# Patient Record
Sex: Male | Born: 1935 | Race: White | Hispanic: No | Marital: Married | Smoking: Never smoker
Health system: Southern US, Community
[De-identification: ages and names within clinical notes are randomized; demographics above are authoritative.]

## PROBLEM LIST (undated history)

## (undated) DIAGNOSIS — F0390 Unspecified dementia without behavioral disturbance: Secondary | ICD-10-CM

## (undated) DIAGNOSIS — I2510 Atherosclerotic heart disease of native coronary artery without angina pectoris: Secondary | ICD-10-CM

## (undated) DIAGNOSIS — M4800 Spinal stenosis, site unspecified: Secondary | ICD-10-CM

## (undated) DIAGNOSIS — J40 Bronchitis, not specified as acute or chronic: Secondary | ICD-10-CM

## (undated) HISTORY — PX: JOINT REPLACEMENT: SHX530

## (undated) HISTORY — PX: APPENDECTOMY: SHX54

---

## 2009-11-03 ENCOUNTER — Emergency Department (HOSPITAL_BASED_OUTPATIENT_CLINIC_OR_DEPARTMENT_OTHER): Admission: EM | Admit: 2009-11-03 | Discharge: 2009-11-03 | Payer: Self-pay | Admitting: Emergency Medicine

## 2009-11-03 ENCOUNTER — Ambulatory Visit: Payer: Self-pay | Admitting: Diagnostic Radiology

## 2009-11-14 ENCOUNTER — Encounter: Admission: RE | Admit: 2009-11-14 | Discharge: 2009-11-14 | Payer: Self-pay | Admitting: Neurosurgery

## 2010-06-30 LAB — URINALYSIS, ROUTINE W REFLEX MICROSCOPIC
Bilirubin Urine: NEGATIVE
Hgb urine dipstick: NEGATIVE
Nitrite: NEGATIVE
Urobilinogen, UA: 1 mg/dL (ref 0.0–1.0)

## 2011-04-24 ENCOUNTER — Encounter (HOSPITAL_COMMUNITY): Payer: Self-pay

## 2011-04-24 ENCOUNTER — Emergency Department (HOSPITAL_COMMUNITY)
Admission: EM | Admit: 2011-04-24 | Discharge: 2011-04-24 | Disposition: A | Discharge status: Home / Self Care | Payer: Medicare Other | Attending: Emergency Medicine | Admitting: Emergency Medicine

## 2011-04-24 ENCOUNTER — Emergency Department (HOSPITAL_COMMUNITY): Payer: Medicare Other

## 2011-04-24 DIAGNOSIS — Z7982 Long term (current) use of aspirin: Secondary | ICD-10-CM | POA: Insufficient documentation

## 2011-04-24 DIAGNOSIS — R0789 Other chest pain: Secondary | ICD-10-CM

## 2011-04-24 DIAGNOSIS — M940 Chondrocostal junction syndrome [Tietze]: Secondary | ICD-10-CM

## 2011-04-24 DIAGNOSIS — R079 Chest pain, unspecified: Secondary | ICD-10-CM | POA: Insufficient documentation

## 2011-04-24 HISTORY — DX: Spinal stenosis, site unspecified: M48.00

## 2011-04-24 LAB — CARDIAC PANEL(CRET KIN+CKTOT+MB+TROPI)
Relative Index: INVALID (ref 0.0–2.5)
Total CK: 70 U/L (ref 7–232)

## 2011-04-24 MED ORDER — ASPIRIN 325 MG PO TABS: 325.0000 mg | ORAL_TABLET | ORAL | Status: DC

## 2011-04-24 MED ORDER — ASPIRIN 81 MG PO CHEW
324.0000 mg | CHEWABLE_TABLET | Freq: Once | ORAL | Status: AC
  Administered 2011-04-24: 324 mg via ORAL
  Filled 2011-04-24: qty 4

## 2011-04-24 NOTE — ED Notes (Signed)
Pt was moving boxes and furniture, pt with c/o pain in the left pec, aware that the pain is there, this am left arm was going numb

## 2011-04-24 NOTE — ED Provider Notes (Addendum)
History     CSN: [id number]  Arrival date & time 04/24/11  0847   First MD Initiated Contact with Patient 04/24/11 [phone]      Chief Complaint  Patient presents with  . Chest Pain    (Consider location/radiation/quality/duration/timing/severity/associated sxs/prior treatment) HPI Comments: The patient is an 78 year old male with no history of coronary artery disease, myocardial infarction, cardiac disease, hypertension, hyperlipidemia, or diabetes. The patient has no significant medical problems. The patient reports localized sharp and aching chest pain at the medial border of the left pectoralis muscle at the costochondral junction that he noticed yesterday with gradual onset, waxing and waning course, persistent, mild in severity, worse with palpation and movement through the chest, not worse with exertion. He denies any shortness of breath, nausea, or vomiting. He denies any diaphoresis. 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 The patient is a nonsmoker. His chest pain is reproducible on palpation of the left costochondral junction and pectoralis muscle.  Patient is a 78 y.o. male presenting with chest pain. The history is provided by the patient.  Chest Pain The chest pain began yesterday. Pertinent negatives for primary symptoms include no fever, no fatigue, no shortness of breath, no cough, no wheezing, no palpitations, no abdominal pain, no nausea, no vomiting and no dizziness.  Pertinent negatives for associated symptoms include no diaphoresis, no numbness and no weakness.     Past Medical History  Diagnosis Date  . Spinal stenosis     History reviewed. No pertinent past surgical history.  History reviewed. No pertinent family history.  History  Substance Use Topics  . Smoking status: Never Smoker   . Smokeless tobacco: Not on file  .  Alcohol Use: No      Review of Systems  Constitutional: Negative for fever, chills, diaphoresis and fatigue.  HENT: Negative.   Eyes: Negative.   Respiratory: Negative for cough, chest tightness, shortness of breath and wheezing.   Cardiovascular: Positive for chest pain. Negative for palpitations.  Gastrointestinal: Negative for nausea, vomiting, abdominal pain and abdominal distention.  Genitourinary: Negative.   Musculoskeletal: Negative for back pain.  Skin: Negative for color change and pallor.  Neurological: Negative for dizziness, syncope, weakness, light-headedness and numbness.  Psychiatric/Behavioral: Negative.     Allergies  Review of patient's allergies indicates no known allergies.  Home Medications   Current Outpatient Rx  Name Route Sig Dispense Refill  . ASPIRIN 325 MG PO TABS Oral Take 325 mg by mouth every 6 (six) hours as needed. For pain    . ASPIRIN EC 81 MG PO TBEC Oral Take 81 mg by mouth daily.    . IBUPROFEN 200 MG PO TABS Oral Take 600 mg by mouth every 8 (eight) hours as needed. For pain    . ADULT MULTIVITAMIN W/MINERALS CH Oral Take 1 tablet by mouth daily.    Marland Kitchen OVER THE COUNTER MEDICATION Oral Take 1 tablet by mouth at bedtime as needed. Sleep Aid      BP 147/78  Pulse 70  Temp 97.8 F (36.6 C)  Resp 15  SpO2 94%  Physical Exam  Nursing note and vitals reviewed. Constitutional: He is oriented to person, place, and time. He appears well-nourished. No distress.  HENT:  Head: Normocephalic and atraumatic.  Mouth/Throat: Oropharynx is clear and moist.  Eyes: EOM are normal. Pupils are  equal, round, and reactive to light.  Neck: Normal range of motion. Neck supple. No JVD present. No tracheal deviation present.  Cardiovascular: Normal rate, regular rhythm, S1 normal, S2 normal, normal heart sounds and intact distal pulses.   No extrasystoles are present. PMI is not displaced.  Exam reveals no gallop and no friction rub.   No murmur  heard. Pulmonary/Chest: Effort normal and breath sounds normal. No accessory muscle usage or stridor. Not tachypneic. No respiratory distress. He has no decreased breath sounds. He has no wheezes. He has no rhonchi. He has no rales. He exhibits tenderness and bony tenderness. He exhibits no crepitus and no retraction.       Palpation of the medial border of the left pectoralis muscle and at the junction of the costosternal joint, the patient has point tenderness that reproduces his pain when pressed.  Abdominal: Soft. Bowel sounds are normal. He exhibits no distension and no mass. There is no tenderness. There is no rebound and no guarding.  Musculoskeletal: Normal range of motion. He exhibits no edema and no tenderness.  Neurological: He is alert and oriented to person, place, and time. No cranial nerve deficit. He exhibits normal muscle tone.  Skin: Skin is warm and dry. No rash noted. He is not diaphoretic. No erythema. No pallor.  Psychiatric: He has a normal mood and affect. His behavior is normal. Judgment and thought content normal.    ED Course  Procedures (including critical care time)   Labs Reviewed  PRO B NATRIURETIC PEPTIDE  CARDIAC PANEL(CRET KIN+CKTOT+MB+TROPI)  CBC  BASIC METABOLIC PANEL   Dg Chest 2 View  04/24/2011  *RADIOLOGY REPORT*  Clinical Data: Left chest pain  CHEST - 2 VIEW  Comparison: None  Findings: Upper-normal size of cardiac silhouette. Mediastinal contours and pulmonary vascularity normal. Peribronchial thickening without infiltrate or effusion. No pneumothorax. 4 mm left mid lung nodule, appears dense, likely tiny calcified granuloma. Atherosclerotic calcification aortic arch. Bones appear demineralized.  IMPRESSION: Mild bronchitic changes.  Original Report Authenticated By: Lollie Marrow, M.D.    Date: 04/24/2011  Rate: 84  Rhythm: normal sinus rhythm  QRS Axis: left  Intervals: PR prolonged  ST/T Wave abnormalities: nonspecific T wave changes   Conduction Disutrbances:first-degree A-V block   Narrative Interpretation: Non-provocative EKG  Old EKG Reviewed: none available    No diagnosis found.  12:43 PM The patient initially only had cardiac enzymes, a EKG, and a chest x-ray ordered by nursing per protocol. I had seen the patient, but became busy with another patient who came in acutely in need of my attention. I did not have a chance to ordered a CBC and basic metabolic panel but I would like to order for the patient's evaluation. The patient's cardiac enzymes returned negative and on hearing this from the nursing staff, and he elected to leave the emergency department AGAINST MEDICAL ADVICE before finishing his care. At this time I do not have any s or cardiac source of chest pain and do perceive that it was chest wall pain. I do not think that the CBC or basic metabolic panel was likely to change the patient's course of care and therefore I'm not contacting him to return to the emergency department. This does appear to be chest wall pain.uggestion that this was a myocardial  MDM  Musculoskeletal chest pain, costochondritis, GERD, Gastrointestinal Chest Pain, Pleuritic Chest Pain, Pneumonia, Pneumothorax, Pulmonary Embolism, Esophageal Spasm, Arrhythmia considered among other potential etiologies in the patient's differential diagnosis.  Felisa Bonier, MD 04/24/11 1245  Felisa Bonier, MD 04/24/11 1248

## 2016-03-21 ENCOUNTER — Telehealth: Payer: Self-pay | Admitting: Internal Medicine

## 2016-05-05 ENCOUNTER — Emergency Department (HOSPITAL_BASED_OUTPATIENT_CLINIC_OR_DEPARTMENT_OTHER): Payer: Medicare Other

## 2016-05-05 ENCOUNTER — Emergency Department (HOSPITAL_BASED_OUTPATIENT_CLINIC_OR_DEPARTMENT_OTHER)
Admission: EM | Admit: 2016-05-05 | Discharge: 2016-05-05 | Disposition: A | Discharge status: Home / Self Care | Payer: Medicare Other | Attending: Emergency Medicine | Admitting: Emergency Medicine

## 2016-05-05 ENCOUNTER — Encounter (HOSPITAL_BASED_OUTPATIENT_CLINIC_OR_DEPARTMENT_OTHER): Payer: Self-pay | Admitting: Emergency Medicine

## 2016-05-05 DIAGNOSIS — R05 Cough: Secondary | ICD-10-CM | POA: Diagnosis present

## 2016-05-05 DIAGNOSIS — M7989 Other specified soft tissue disorders: Secondary | ICD-10-CM | POA: Diagnosis not present

## 2016-05-05 DIAGNOSIS — Z7982 Long term (current) use of aspirin: Secondary | ICD-10-CM | POA: Insufficient documentation

## 2016-05-05 DIAGNOSIS — I2510 Atherosclerotic heart disease of native coronary artery without angina pectoris: Secondary | ICD-10-CM | POA: Insufficient documentation

## 2016-05-05 DIAGNOSIS — Z79899 Other long term (current) drug therapy: Secondary | ICD-10-CM | POA: Insufficient documentation

## 2016-05-05 DIAGNOSIS — J209 Acute bronchitis, unspecified: Secondary | ICD-10-CM | POA: Diagnosis not present

## 2016-05-05 HISTORY — DX: Atherosclerotic heart disease of native coronary artery without angina pectoris: I25.10

## 2016-05-05 HISTORY — DX: Bronchitis, not specified as acute or chronic: J40

## 2016-05-05 MED ORDER — PREDNISONE 50 MG PO TABS
60.0000 mg | ORAL_TABLET | Freq: Once | ORAL | Status: AC
  Administered 2016-05-05: 60 mg via ORAL
  Filled 2016-05-05: qty 1

## 2016-05-05 MED ORDER — PREDNISONE 20 MG PO TABS: ORAL_TABLET | ORAL | 0 refills | Status: DC

## 2016-05-05 MED ORDER — DOXYCYCLINE HYCLATE 100 MG PO CAPS: 100.0000 mg | ORAL_CAPSULE | Freq: Two times a day (BID) | ORAL | 0 refills | Status: DC

## 2016-05-05 MED ORDER — ALBUTEROL SULFATE HFA 108 (90 BASE) MCG/ACT IN AERS
2.0000 | INHALATION_SPRAY | Freq: Once | RESPIRATORY_TRACT | Status: AC
  Administered 2016-05-05: 2 via RESPIRATORY_TRACT
  Filled 2016-05-05: qty 6.7

## 2016-05-05 MED ORDER — ALBUTEROL SULFATE (2.5 MG/3ML) 0.083% IN NEBU
5.0000 mg | INHALATION_SOLUTION | Freq: Once | RESPIRATORY_TRACT | Status: AC
  Administered 2016-05-05: 5 mg via RESPIRATORY_TRACT
  Filled 2016-05-05: qty 6

## 2016-05-05 MED ORDER — DOXYCYCLINE HYCLATE 100 MG PO TABS
100.0000 mg | ORAL_TABLET | Freq: Once | ORAL | Status: AC
  Administered 2016-05-05: 100 mg via ORAL
  Filled 2016-05-05: qty 1

## 2016-05-05 NOTE — ED Provider Notes (Signed)
[facility] Provider Note   CSN: [id number] Arrival date & time: 05/05/16  2032  By signing my name below, I, Rosario Adie, attest that this documentation has been prepared under the direction and in the presence of Rolan Bucco, MD. Electronically Signed: Rosario Adie, ED Scribe. 05/05/16. 10:12 PM.  History   Chief Complaint Chief Complaint  Patient presents with  . Cough   The history is provided by the patient. No language interpreter was used.    HPI Comments: Jacob Kirk is a 83 y.o. male who presents to the Emergency Department complaining of persistent shortness of breath beginning this morning, significantly worsening since this morning. Pt reports associated rhinorrhea, dry cough, and congestion over the past two days as well. Pt additionally reports that he has been experiencing chest pain secondary to coughing only. He also notes that he has bilateral leg swelling at baseline, but this has remained unchanged since the onset of his symptoms. Pt has been using saline sprays at home without relief of his symptoms. Per family member, he has a h/o bronchitis yearly and his symptoms are similar to this. He has used Albuterol inhalers and nebulizing treatments for this issue with prior bronchitis which has previously controlled his symptoms related to this. Pt denies nausea, vomiting, fever, or any other associated symptoms.   Past Medical History:  Diagnosis Date  . Bronchitis   . Coronary artery disease   . Spinal stenosis    There are no active problems to display for this patient.  Past Surgical History:  Procedure Laterality Date  . JOINT REPLACEMENT      Home Medications    Prior to Admission medications   Medication Sig Start Date End Date Taking? Authorizing Provider  aspirin 325 MG tablet Take 325 mg by mouth every 6 (six) hours as needed. For pain   Yes Historical Provider, MD  carvedilol (COREG) 12.5 MG tablet Take by mouth 1 day  or 1 dose.   Yes Historical Provider, MD  aspirin EC 81 MG tablet Take 81 mg by mouth daily.    Historical Provider, MD  doxycycline (VIBRAMYCIN) 100 MG capsule Take 1 capsule (100 mg total) by mouth 2 (two) times daily. One po bid x 7 days 05/05/16   Rolan Bucco, MD  ibuprofen (ADVIL,MOTRIN) 200 MG tablet Take 600 mg by mouth every 8 (eight) hours as needed. For pain    Historical Provider, MD  Multiple Vitamin (MULITIVITAMIN WITH MINERALS) TABS Take 1 tablet by mouth daily.    Historical Provider, MD  OVER THE COUNTER MEDICATION Take 1 tablet by mouth at bedtime as needed. Sleep Aid    Historical Provider, MD  predniSONE (DELTASONE) 20 MG tablet 2 tabs po daily x 4 days 05/05/16   Rolan Bucco, MD   Family History No family history on file.  Social History Social History  Substance Use Topics  . Smoking status: Never Smoker  . Smokeless tobacco: Never Used  . Alcohol use No   Allergies   Patient has no known allergies.  Review of Systems Review of Systems  Constitutional: Negative for chills, diaphoresis, fatigue and fever.  HENT: Positive for congestion and rhinorrhea. Negative for sneezing.   Eyes: Negative.   Respiratory: Positive for cough and shortness of breath. Negative for chest tightness.   Cardiovascular: Positive for chest pain (2/2 coughing) and leg swelling (baseline, unchanged).  Gastrointestinal: Negative for abdominal pain, blood in stool, diarrhea, nausea and vomiting.  Genitourinary: Negative for difficulty urinating,  flank pain, frequency and hematuria.  Musculoskeletal: Negative for arthralgias and back pain.  Skin: Negative for rash.  Neurological: Negative for dizziness, speech difficulty, weakness, numbness and headaches.  All other systems reviewed and are negative.  Physical Exam Updated Vital Signs BP 136/65 (BP Location: Right Arm)   Pulse 104   Temp 97.6 F (36.4 C) (Oral)   Resp 18   Ht 5\' 7"  (1.702 m)   Wt 180 lb (81.6 kg)   SpO2 96%   BMI  28.19 kg/m   Physical Exam  Constitutional: He is oriented to person, place, and time. He appears well-developed and well-nourished.  HENT:  Head: Normocephalic and atraumatic.  Right Ear: External ear normal.  Left Ear: External ear normal.  Mouth/Throat: Oropharynx is clear and moist. No oropharyngeal exudate.  Eyes: Pupils are equal, round, and reactive to light.  Neck: Normal range of motion. Neck supple.  Cardiovascular: Normal rate, regular rhythm and normal heart sounds.   Pulmonary/Chest: Effort normal. No respiratory distress. He has wheezes. He has no rales. He exhibits no tenderness.  Some diminished breath sounds bilaterally and some expiratory wheezing, Mild tachypnea but no increased work of breathing.   Abdominal: Soft. Bowel sounds are normal. There is no tenderness. There is no rebound and no guarding.  Musculoskeletal: Normal range of motion. He exhibits no edema.  Lymphadenopathy:    He has no cervical adenopathy.  Neurological: He is alert and oriented to person, place, and time.  Skin: Skin is warm and dry. No rash noted.  Psychiatric: He has a normal mood and affect.   ED Treatments / Results  DIAGNOSTIC STUDIES: Oxygen Saturation is 100% on RA, normal by my interpretation.   COORDINATION OF CARE: 10:10 PM-Discussed next steps with pt including CXR. Pt verbalized understanding and is agreeable with the plan.   Labs (all labs ordered are listed, but only abnormal results are displayed) Labs Reviewed - No data to display  EKG  EKG Interpretation None      Radiology Dg Chest 2 View  Result Date: 05/05/2016 CLINICAL DATA:  Cough and congestion for several days EXAM: CHEST  2 VIEW COMPARISON:  None. FINDINGS: Normal mediastinum and cardiac silhouette. Chronic central bronchitic markings. Normal pulmonary vasculature. No effusion, infiltrate, or pneumothorax. IMPRESSION: Chronic bronchitic markings.  No acute findings. Electronically Signed   By: Genevive BiStewart   Edmunds M.D.   On: 05/05/2016 21:40   Procedures Procedures   Medications Ordered in ED Medications  albuterol (PROVENTIL HFA;VENTOLIN HFA) 108 (90 Base) MCG/ACT inhaler 2 puff (not administered)  doxycycline (VIBRA-TABS) tablet 100 mg (not administered)  predniSONE (DELTASONE) tablet 60 mg (60 mg Oral Given 05/05/16 2217)  albuterol (PROVENTIL) (2.5 MG/3ML) 0.083% nebulizer solution 5 mg (5 mg Nebulization Given 05/05/16 2221)    Initial Impression / Assessment and Plan / ED Course  I have reviewed the triage vital signs and the nursing notes.  Pertinent labs & imaging results that were available during my care of the patient were reviewed by me and considered in my medical decision making (see chart for details).     Patient presents with cough and wheezing. His chest x-ray does not show evidence of pneumonia or fluid overload. His symptoms are consistent with bronchitis. He was given a nebulizer treatment in the ED and is feeling much better after this. He was also given a dose of prednisone. He was discharged home in good condition. He is maintaining normal oxygen saturations. He has no increased work of breathing. He  was dispensed an albuterol inhaler to use 2 puffs every 4-6 hours for the next few days. He was given prescriptions for a five-day course of prednisone and doxycycline. He was encouraged to follow-up with his PCP. Return precautions were given.  Final Clinical Impressions(s) / ED Diagnoses   Final diagnoses:  Acute bronchitis, unspecified organism   New Prescriptions New Prescriptions   DOXYCYCLINE (VIBRAMYCIN) 100 MG CAPSULE    Take 1 capsule (100 mg total) by mouth 2 (two) times daily. One po bid x 7 days   PREDNISONE (DELTASONE) 20 MG TABLET    2 tabs po daily x 4 days   I personally performed the services described in this documentation, which was scribed in my presence.  The recorded information has been reviewed and considered.      Rolan Bucco,  MD 05/05/16 [phone]

## 2016-05-05 NOTE — ED Notes (Signed)
Pt given d/c instructions as per chart. Verbalizes understanding. No questions. Rx x 2. 

## 2016-05-05 NOTE — ED Triage Notes (Addendum)
Per son, congestion, cough and SOB, today. Prior hx of bronchitis

## 2017-08-27 ENCOUNTER — Other Ambulatory Visit: Payer: Self-pay | Admitting: Ophthalmology

## 2017-08-27 DIAGNOSIS — H5462 Unqualified visual loss, left eye, normal vision right eye: Secondary | ICD-10-CM

## 2017-08-29 ENCOUNTER — Ambulatory Visit
Admission: RE | Admit: 2017-08-29 | Discharge: 2017-08-29 | Disposition: A | Discharge status: Home / Self Care | Payer: Medicare Other | Source: Ambulatory Visit | Attending: Ophthalmology | Admitting: Ophthalmology

## 2017-08-29 DIAGNOSIS — H5462 Unqualified visual loss, left eye, normal vision right eye: Secondary | ICD-10-CM

## 2017-11-26 ENCOUNTER — Encounter: Payer: Self-pay | Admitting: Internal Medicine

## 2019-01-12 ENCOUNTER — Emergency Department (HOSPITAL_COMMUNITY): Payer: Medicare Other

## 2019-01-12 ENCOUNTER — Observation Stay (HOSPITAL_COMMUNITY): Payer: Medicare Other

## 2019-01-12 ENCOUNTER — Other Ambulatory Visit: Payer: Self-pay

## 2019-01-12 ENCOUNTER — Encounter (HOSPITAL_COMMUNITY): Payer: Self-pay | Admitting: Emergency Medicine

## 2019-01-12 ENCOUNTER — Observation Stay (HOSPITAL_COMMUNITY)
Admission: EM | Admit: 2019-01-12 | Discharge: 2019-01-13 | Disposition: A | Discharge status: Home / Self Care | Payer: Medicare Other | Attending: Internal Medicine | Admitting: Internal Medicine

## 2019-01-12 DIAGNOSIS — I4510 Unspecified right bundle-branch block: Secondary | ICD-10-CM | POA: Insufficient documentation

## 2019-01-12 DIAGNOSIS — Z79899 Other long term (current) drug therapy: Secondary | ICD-10-CM | POA: Diagnosis not present

## 2019-01-12 DIAGNOSIS — F5101 Primary insomnia: Secondary | ICD-10-CM

## 2019-01-12 DIAGNOSIS — Z66 Do not resuscitate: Secondary | ICD-10-CM | POA: Insufficient documentation

## 2019-01-12 DIAGNOSIS — I10 Essential (primary) hypertension: Secondary | ICD-10-CM | POA: Diagnosis not present

## 2019-01-12 DIAGNOSIS — I200 Unstable angina: Secondary | ICD-10-CM | POA: Diagnosis not present

## 2019-01-12 DIAGNOSIS — I453 Trifascicular block: Secondary | ICD-10-CM | POA: Diagnosis not present

## 2019-01-12 DIAGNOSIS — I2510 Atherosclerotic heart disease of native coronary artery without angina pectoris: Secondary | ICD-10-CM | POA: Diagnosis not present

## 2019-01-12 DIAGNOSIS — Z20828 Contact with and (suspected) exposure to other viral communicable diseases: Secondary | ICD-10-CM | POA: Insufficient documentation

## 2019-01-12 DIAGNOSIS — R072 Precordial pain: Secondary | ICD-10-CM | POA: Diagnosis not present

## 2019-01-12 DIAGNOSIS — I252 Old myocardial infarction: Secondary | ICD-10-CM | POA: Diagnosis not present

## 2019-01-12 DIAGNOSIS — I340 Nonrheumatic mitral (valve) insufficiency: Secondary | ICD-10-CM | POA: Insufficient documentation

## 2019-01-12 DIAGNOSIS — H409 Unspecified glaucoma: Secondary | ICD-10-CM

## 2019-01-12 DIAGNOSIS — R079 Chest pain, unspecified: Secondary | ICD-10-CM | POA: Diagnosis present

## 2019-01-12 DIAGNOSIS — E785 Hyperlipidemia, unspecified: Secondary | ICD-10-CM | POA: Insufficient documentation

## 2019-01-12 LAB — CBC
HCT: 41.3 % (ref 39.0–52.0)
HCT: 39.6 % (ref 39.0–52.0)
Hemoglobin: 13.3 g/dL (ref 13.0–17.0)
Hemoglobin: 13.5 g/dL (ref 13.0–17.0)
MCH: 32.4 pg (ref 26.0–34.0)
MCH: 32.8 pg (ref 26.0–34.0)
MCHC: 32.2 g/dL (ref 30.0–36.0)
MCHC: 34.1 g/dL (ref 30.0–36.0)
MCV: 100.5 fL — ABNORMAL HIGH (ref 80.0–100.0)
MCV: 96.1 fL (ref 80.0–100.0)
Platelets: 204 10*3/uL (ref 150–400)
Platelets: 182 10*3/uL (ref 150–400)
RBC: 4.11 MIL/uL — ABNORMAL LOW (ref 4.22–5.81)
RBC: 4.12 MIL/uL — ABNORMAL LOW (ref 4.22–5.81)
RDW: 13.7 % (ref 11.5–15.5)
RDW: 13.6 % (ref 11.5–15.5)
WBC: 5.6 10*3/uL (ref 4.0–10.5)
WBC: 4.2 10*3/uL (ref 4.0–10.5)
nRBC: 0 % (ref 0.0–0.2)
nRBC: 0 % (ref 0.0–0.2)

## 2019-01-12 LAB — TROPONIN I (HIGH SENSITIVITY)
Troponin I (High Sensitivity): 4 ng/L (ref <18)
Troponin I (High Sensitivity): 5 ng/L (ref <18)

## 2019-01-12 LAB — BASIC METABOLIC PANEL
Anion gap: 9 (ref 5–15)
BUN: 29 mg/dL — ABNORMAL HIGH (ref 8–23)
CO2: 20 mmol/L — ABNORMAL LOW (ref 22–32)
Calcium: 8.5 mg/dL — ABNORMAL LOW (ref 8.9–10.3)
Chloride: 109 mmol/L (ref 98–111)
Creatinine, Ser: 1.12 mg/dL (ref 0.61–1.24)
GFR calc Af Amer: >60 mL/min (ref >60)
GFR calc non Af Amer: 57 mL/min — ABNORMAL LOW (ref >60)
Glucose, Bld: 108 mg/dL — ABNORMAL HIGH (ref 70–99)
Potassium: 3.9 mmol/L (ref 3.5–5.1)
Sodium: 138 mmol/L (ref 135–145)

## 2019-01-12 LAB — CREATININE, SERUM
Creatinine, Ser: 0.98 mg/dL (ref 0.61–1.24)
GFR calc Af Amer: >60 mL/min (ref >60)
GFR calc non Af Amer: >60 mL/min (ref >60)

## 2019-01-12 LAB — SARS CORONAVIRUS 2 (TAT 6-24 HRS): SARS Coronavirus 2: NEGATIVE

## 2019-01-12 LAB — D-DIMER, QUANTITATIVE: D-Dimer, Quant: 1.24 ug/mL-FEU — ABNORMAL HIGH (ref 0.00–0.50)

## 2019-01-12 MED ORDER — ONDANSETRON HCL 4 MG/2ML IJ SOLN: 4.0000 mg | Freq: Four times a day (QID) | INTRAMUSCULAR | Status: DC | PRN

## 2019-01-12 MED ORDER — ASPIRIN EC 81 MG PO TBEC
81.0000 mg | DELAYED_RELEASE_TABLET | Freq: Every day | ORAL | Status: DC
  Administered 2019-01-13: 81 mg via ORAL
  Filled 2019-01-12: qty 1

## 2019-01-12 MED ORDER — LIFITEGRAST 5 % OP SOLN: 1.0000 [drp] | Freq: Two times a day (BID) | OPHTHALMIC | Status: DC

## 2019-01-12 MED ORDER — LATANOPROST 0.005 % OP SOLN
1.0000 [drp] | Freq: Every day | OPHTHALMIC | Status: DC
  Filled 2019-01-12: qty 2.5

## 2019-01-12 MED ORDER — IOHEXOL 300 MG/ML  SOLN
100.0000 mL | Freq: Once | INTRAMUSCULAR | Status: AC | PRN
  Administered 2019-01-12: 100 mL via INTRAVENOUS

## 2019-01-12 MED ORDER — DIPHENHYDRAMINE HCL 25 MG PO CAPS
25.0000 mg | ORAL_CAPSULE | Freq: Every day | ORAL | Status: DC
  Administered 2019-01-13: 25 mg via ORAL
  Filled 2019-01-12: qty 1

## 2019-01-12 MED ORDER — ACETAMINOPHEN 325 MG PO TABS: 650.0000 mg | ORAL_TABLET | ORAL | Status: DC | PRN

## 2019-01-12 MED ORDER — ENOXAPARIN SODIUM 40 MG/0.4ML ~~LOC~~ SOLN
40.0000 mg | SUBCUTANEOUS | Status: DC
  Administered 2019-01-12: 40 mg via SUBCUTANEOUS
  Filled 2019-01-12: qty 0.4

## 2019-01-12 MED ORDER — SODIUM CHLORIDE 0.9% FLUSH: 3.0000 mL | Freq: Once | INTRAVENOUS | Status: DC

## 2019-01-12 MED ORDER — OLOPATADINE HCL 0.1 % OP SOLN
1.0000 [drp] | Freq: Two times a day (BID) | OPHTHALMIC | Status: DC
  Administered 2019-01-12 – 2019-01-13 (×2): 1 [drp] via OPHTHALMIC
  Filled 2019-01-12: qty 5

## 2019-01-12 NOTE — H&P (Signed)
History and Physical:    Jacob Kirk   ZOX:[id number] DOB: 17-Feb-1925 DOA: 01/12/2019  Referring MD/provider: Dr. Jacqulyn Bath PCP: Jacob Harries, MD   Patient coming from: Home  Chief Complaint: Chest pain  History of Present Illness:   Jacob Kirk is an 85 y.o. male with past medical history significant for coronary artery disease which he describes as "a silent heart attack which occurred about 6 years ago".It sounds like patient may also have a history of perhaps a regurgitant valve which he describes as "the third chamber of my heart was pushing the blood backward which is why I had the silent heart attack".  Patient used to be followed at [facility] in [place] for his cardiac care.   Patient states he was in his usual state of good health until earlier today when he developed a sensation of shortness of breath while at rest and weakness.  This was subsequently followed by sensation of "my diaphragm being pushed up into my thorax".  He then developed a sensation of pressure and squeezing in his chest and profound weakness of his arms as such that he was unable to hold his Ventolin inhaler.  Patient felt very weak and thought he was going to pass out however he did not.  Patient was alone and by himself and was unable to ask for help until about an hour later when somebody passed by and he asked for help.  Patient states the symptoms lasted about an hour and a half until EMS arrived.  He was given nitroglycerin under his tongue but he thinks the chest discomfort had already worn off before he got the nitroglycerin.  At present patient states he feels back to baseline although may be a little weak.  He denied any nausea or vomiting during that episode.  He denied any profuse diaphoresis did feel a little clammy.  Patient denies any recent fevers or chills.  No new cough.  No nausea vomiting or diarrhea.  No unexplained weight loss.  ED Course:  The patient was treated with  aspirin.  Troponins initially were negative.  EKG showed right bundle branch block without changes.  Patient was noted to have an elevated d-dimer and a CT angiogram was ordered which was negative for pulmonary embolus.  ROS:   ROS   Review of Systems: General: No fever, chills, weight changes Skin: No rashes, lesions, wounds Endocrine: no heat/cold intolerance, no polyuria GI: No nausea, vomiting, diarrhea, constipation GU: No dysuria, increased frequency CNS: No numbness, dizziness, headache Blood/lymphatics: No easy bruising, bleeding Mood/affect: No anxiety/depression    Past Medical History:   Past Medical History:  Diagnosis Date  . Bronchitis   . Coronary artery disease   . Spinal stenosis     Past Surgical History:   Past Surgical History:  Procedure Laterality Date  . JOINT REPLACEMENT      Social History:   Social History   Socioeconomic History  . Marital status: Married    Spouse name: Not on file  . Number of children: Not on file  . Years of education: Not on file  . Highest education level: Not on file  Occupational History  . Not on file  Social Needs  . Financial resource strain: Not on file  . Food insecurity    Worry: Not on file    Inability: Not on file  . Transportation needs    Medical: Not on file    Non-medical: Not on file  Tobacco Use  . Smoking status: Never Smoker  . Smokeless tobacco: Never Used  Substance and Sexual Activity  . Alcohol use: No  . Drug use: No  . Sexual activity: Not on file  Lifestyle  . Physical activity    Days per week: Not on file    Minutes per session: Not on file  . Stress: Not on file  Relationships  . Social Musician on phone: Not on file    Gets together: Not on file    Attends religious service: Not on file    Active member of club or organization: Not on file    Attends meetings of clubs or organizations: Not on file    Relationship status: Not on file  . Intimate partner  violence    Fear of current or ex partner: Not on file    Emotionally abused: Not on file    Physically abused: Not on file    Forced sexual activity: Not on file  Other Topics Concern  . Not on file  Social History Narrative  . Not on file    Allergies   Patient has no known allergies.  Family history:   No family history on file.  Current Medications:   Prior to Admission medications   Medication Sig Start Date End Date Taking? Authorizing Provider  diphenhydrAMINE (BENADRYL) 25 MG tablet Take 75 mg by mouth at bedtime.   Yes [provider]  ibuprofen (ADVIL,MOTRIN) 200 MG tablet Take 800 mg by mouth every 8 (eight) hours as needed for mild pain.    Yes [provider]  LUMIGAN 0.01 % SOLN Place 1 drop into both eyes daily. 11/26/18  Yes [provider]  olopatadine (PATADAY) 0.1 % ophthalmic solution Place 1 drop into both eyes 2 (two) times daily.   Yes [provider]  OVER THE COUNTER MEDICATION Take by mouth daily. CBD Oil   Yes [provider]  XIIDRA 5 % SOLN Place 1 drop into both eyes 2 (two) times daily. 09/11/18  Yes [provider]    Physical Exam:   Vitals:   01/12/19 1400 01/12/19 1500 01/12/19 1600 01/12/19 1700  BP: (!) 152/69 (!) 149/128 (!) 150/72 (!) 141/81  Pulse: 77  81 85  Resp: 14 18 (!) 22 (!) 24  Temp:      TempSrc:      SpO2: 98%  98% 99%     Physical Exam: Blood pressure (!) 141/81, pulse 85, temperature 98 F (36.7 C), temperature source Oral, resp. rate (!) 24, SpO2 99 %. Gen: Thin elderly man for lying flat in bed in no acute distress Eyes: Sclerae anicteric. Conjunctiva mildly injected. Neck: Supple, no jugular venous distention. Chest: Moderately good air entry bilaterally with few rales at bases. CV: Distant, regular, patient has a 2/6 systolic murmur left sternal border.   Abdomen: NABS, soft, nondistended, nontender. No tenderness to light or deep palpation. No rebound, no  guarding. Extremities: No edema.  Skin: Warm and dry. No rashes, lesions or wounds. Neuro: Alert and oriented times 3; grossly nonfocal. Psych: Patient is cooperative, logical and coherent with appropriate mood and affect.  Data Review:    Labs: Basic Metabolic Panel: Recent Labs  Lab 01/12/19 1032  NA 138  K 3.9  CL 109  CO2 20*  GLUCOSE 108*  BUN 29*  CREATININE 1.12  CALCIUM 8.5*   Liver Function Tests: No results for input(s): AST, ALT, ALKPHOS, BILITOT, PROT, ALBUMIN in  the last 168 hours. No results for input(s): LIPASE, AMYLASE in the last 168 hours. No results for input(s): AMMONIA in the last 168 hours. CBC: Recent Labs  Lab 01/12/19 1032  WBC 5.6  HGB 13.3  HCT 41.3  MCV 100.5*  PLT 204   Cardiac Enzymes: No results for input(s): CKTOTAL, CKMB, CKMBINDEX, TROPONINI in the last 168 hours.  BNP (last 3 results) No results for input(s): PROBNP in the last 8760 hours. CBG: No results for input(s): GLUCAP in the last 168 hours.  Urinalysis    Component Value Date/Time   COLORURINE AMBER BIOCHEMICALS MAY BE AFFECTED BY COLOR (A) 11/03/2009 0955   APPEARANCEUR CLOUDY (A) 11/03/2009 0955   LABSPEC 1.027 11/03/2009 0955   PHURINE 5.5 11/03/2009 0955   GLUCOSEU NEGATIVE 11/03/2009 0955   HGBUR NEGATIVE 11/03/2009 0955   BILIRUBINUR NEGATIVE 11/03/2009 0955   KETONESUR 15 (A) 11/03/2009 0955   PROTEINUR NEGATIVE 11/03/2009 0955   UROBILINOGEN 1.0 11/03/2009 0955   NITRITE NEGATIVE 11/03/2009 0955   LEUKOCYTESUR  11/03/2009 0955    NEGATIVE MICROSCOPIC NOT DONE ON URINES WITH NEGATIVE PROTEIN, BLOOD, LEUKOCYTES, NITRITE, OR GLUCOSE <1000 mg/dL.      Radiographic Studies: Dg Chest 2 View  Result Date: 01/12/2019 CLINICAL DATA:  Chest pain.  History of chronic pneumonia. EXAM: CHEST - 2 VIEW COMPARISON:  May 05, 2016 FINDINGS: Mild atelectasis in the left base. The heart, hila, mediastinum, lungs, and pleura are normal. IMPRESSION: No active  cardiopulmonary disease. Electronically Signed   By: Gerome Samavid  Williams III M.D   On: 01/12/2019 10:54   Ct Angio Chest Pe W And/or Wo Contrast  Result Date: 01/12/2019 CLINICAL DATA:  Ct pe chest 70ml omn 350, Pt states the pain last until ems came and gave him something to put under his tongue which helped the pain. Pt states his pain is better but now just feels weak and lightheaded. Pt also took 324 asa ^14500mL OMNIPAQUE IOHEXOL 300 MG/ML SOLNPE suspected, intermediate prob, positive D-dimer EXAM: CT ANGIOGRAPHY CHEST WITH CONTRAST TECHNIQUE: Multidetector CT imaging of the chest was performed using the standard protocol during bolus administration of intravenous contrast. Multiplanar CT image reconstructions and MIPs were obtained to evaluate the vascular anatomy. CONTRAST:  100mL OMNIPAQUE IOHEXOL 300 MG/ML  SOLN COMPARISON:  None. FINDINGS: Cardiovascular: No filling defects within the pulmonary arteries to suggest acute pulmonary embolism. No acute findings of the aorta or great vessels. No pericardial fluid. Mediastinum/Nodes: No axillary supraclavicular adenopathy. No mediastinal hilar adenopathy no pericardial effusion. Esophagus normal. Subcarinal calcified nodes. Lungs/Pleura: No pulmonary infarction. Scattered calcified granulomas. Mild ground-glass regions within the lower and upper lobes. Upper Abdomen: Limited view of the liver, kidneys, pancreas are unremarkable. Normal adrenal glands. Musculoskeletal: No acute osseous abnormality. Review of the MIP images confirms the above findings. IMPRESSION: . 1. No acute pulmonary embolism 2. Diffuse ground-glass opacity in the lung representing atelectasis or mild pulmonary edema. 3. Prior granulomatous disease with calcified mediastinal lymph nodes and scattered calcified pulmonary granulomas. Electronically Signed   By: Genevive BiStewart  Edmunds M.D.   On: 01/12/2019 17:54    EKG: Independently reviewed.  Patient has sinus rhythm at 90.  He has a long  first-degree heart block.  He has left anterior hemiblock with left axis deviation.  He has right bundle branch block.  No acute ST-T wave changes.   Assessment/Plan:   Principal Problem:   Chest pain Active Problems:   Glaucoma  The 85 year old man presents with substernal chest pressure at rest  lasting for 1 hour.  EKG is without any acute ST-T wave changes and troponins are negative.  However it is very specific about his history and it sounds possibly like it could be unstable angina/ACS.  CHEST PAIN Patient is presently chest pain-free. No acute ST-T wave changes on EKG, troponins are negative. Patient received aspirin in the ED and will continue aspirin 81 mg in the morning. Will request cardiology consult for recommendations for further diagnostics.  ELEVATED D DIMER CT angiogram that evidence of pulmonary embolus. He does have some groundglass opacities consistent with possible mild pulmonary edema. Echocardiogram ordered.  GLAUCOMA Continue home medications  INSOMNIA Continue Benadryl 75 mg at bedtime per home doses   Other information:   DVT prophylaxis: Lovenox ordered. Code Status: Patient is DNR Family Communication: Patient's son was at bedside throughout Disposition Plan: Home Consults called: Cardiology Admission status: Observation   The medical decision making is of moderate complexity, therefore this is a level 2 visit.  Jacob Kirk  If 7PM-7AM, please contact night-coverage [email or web address] Password [facility] 01/12/2019, 7:19 PM

## 2019-01-12 NOTE — Progress Notes (Signed)
Cardiology Consultation:   Patient ID: Kinan Safley MRN: [id number]; DOB: 01-02-1925  Admit date: 01/12/2019 Date of Consult: 01/12/2019  Primary Care Provider: Tracey Harries, MD Primary Cardiologist: New (Alper Guilmette) Primary Electrophysiologist:  None    Patient Profile:   Rutledge Selsor is a 85 y.o. male with a hx of arrhythmia and HTN who is being seen today for the evaluation of chest/epigastric discomfort at the request of Dr. Luberta Robertson.  History of Present Illness:   Mr. Kleinert had acute onset of severe dyspnea and severe lower chest/epigastric discomfort around 0830 hrs. Today, while in the restroom.  He felt extremely weak and ill but did not have nausea or vomiting.  He did not have near syncope or syncope and denies palpitations.  He describes the discomfort as sensation that his diaphragm was pushed high up into his chest, preventing him from taking in additional breath.  Using his albuterol inhaler but it did not really make much difference.  He then felt a wringing sensation in his lower chest and epigastrium.  His symptoms lasted for at least 30 minutes.  His son describes him as looking extremely pale and "sallow", clammy although not overtly diaphoretic.  He was extremely weak and had a hard time holding them up when they try to take him to the car.  They eventually called 911.  Sublingual nitroglycerin apparently offered some relief.  He has history of "coronary artery disease" and "silent heart attack" are very doubtful.  It sounds like his primary care provider in [facility] thought he had a heart attack, but then he was referred to a cardiologist who treated him with a beta-blocker for what sounds like rhythm abnormalities.  He took carvedilol for many years but is no longer on it.  He is lived in [place] for the last 3 years with his son [name].  He is a retired Engineer, water.  He has been a widow for the last 6 years.   Heart Pathway Score:  HEAR Score: 6   Past Medical History:  Diagnosis Date  . Bronchitis   . Coronary artery disease   . Spinal stenosis     Past Surgical History:  Procedure Laterality Date  . JOINT REPLACEMENT       Home Medications:  Prior to Admission medications   Medication Sig Start Date End Date Taking? Authorizing Provider  diphenhydrAMINE (BENADRYL) 25 MG tablet Take 75 mg by mouth at bedtime.   Yes [provider]  ibuprofen (ADVIL,MOTRIN) 200 MG tablet Take 800 mg by mouth every 8 (eight) hours as needed for mild pain.    Yes [provider]  LUMIGAN 0.01 % SOLN Place 1 drop into both eyes daily. 11/26/18  Yes [provider]  olopatadine (PATADAY) 0.1 % ophthalmic solution Place 1 drop into both eyes 2 (two) times daily.   Yes [provider]  OVER THE COUNTER MEDICATION Take by mouth daily. CBD Oil   Yes [provider]  XIIDRA 5 % SOLN Place 1 drop into both eyes 2 (two) times daily. 09/11/18  Yes [provider]    Inpatient Medications: Scheduled Meds: . sodium chloride flush  3 mL Intravenous Once   Continuous Infusions:  PRN Meds:   Allergies:   No Known Allergies  Social History:   Social History   Socioeconomic History  . Marital status: Married    Spouse name: Not on file  . Number of children: Not on file  . Years of education: Not on  file  . Highest education level: Not on file  Occupational History  . Not on file  Social Needs  . Financial resource strain: Not on file  . Food insecurity    Worry: Not on file    Inability: Not on file  . Transportation needs    Medical: Not on file    Non-medical: Not on file  Tobacco Use  . Smoking status: Never Smoker  . Smokeless tobacco: Never Used  Substance and Sexual Activity  . Alcohol use: No  . Drug use: No  . Sexual activity: Not on file  Lifestyle  . Physical activity    Days per week: Not on file    Minutes per session: Not on file  . Stress: Not on file   Relationships  . Social Musicianconnections    Talks on phone: Not on file    Gets together: Not on file    Attends religious service: Not on file    Active member of club or organization: Not on file    Attends meetings of clubs or organizations: Not on file    Relationship status: Not on file  . Intimate partner violence    Fear of current or ex partner: Not on file    Emotionally abused: Not on file    Physically abused: Not on file    Forced sexual activity: Not on file  Other Topics Concern  . Not on file  Social History Narrative  . Not on file    Family History:   No significant history of coronary or vascular disease in his family.   ROS:  Please see the history of present illness.  All other ROS reviewed and negative.     Physical Exam/Data:   Vitals:   01/12/19 1330 01/12/19 1400 01/12/19 1500 01/12/19 1600  BP:  (!) 152/69 (!) 149/128 (!) 150/72  Pulse: 86 77  81  Resp: 14 14 18  (!) 22  Temp:      TempSrc:      SpO2: 96% 98%  98%   No intake or output data in the 24 hours ending 01/12/19 1730 Last 3 Weights 05/05/2016  Weight (lbs) 180 lb  Weight (kg) 81.647 kg     There is no height or weight on file to calculate BMI.  General:  Well nourished, well developed, in no acute distress HEENT: normal Lymph: no adenopathy Neck: no JVD Endocrine:  No thryomegaly Vascular: No carotid bruits; FA pulses 2+ bilaterally without bruits  Cardiac:  normal S1, S2; RRR; no murmur  Lungs:  clear to auscultation bilaterally, no wheezing, rhonchi or rales  Abd: soft, nontender, no hepatomegaly  Ext: no edema Musculoskeletal:  No deformities, BUE and BLE strength normal and equal Skin: warm and dry  Neuro:  CNs 2-12 intact, no focal abnormalities noted Psych:  Normal affect   EKG:  The EKG was personally reviewed and demonstrates: Sinus rhythm with first-degree AV block, right bundle branch block, left anterior fascicular block, no acute ischemic abnormalities Telemetry:  was  personally reviewed and demonstrates sinus rhythm with occasional "dropped beats" that appears to be due to very long cycles of second-degree AV block, Mobitz type I.  The longest pause is well under 2 seconds and there is no significant bradycardia  Relevant CV Studies: None available  Quick review of chest CT angiography images immediately after they were performed shows no evidence of pulmonary embolism that I can see, but there is a large calcified plaque in the proximal  LAD artery  Laboratory Data:  High Sensitivity Troponin:   Recent Labs  Lab 01/12/19 1032 01/12/19 1226  TROPONINIHS 4 5     Chemistry Recent Labs  Lab 01/12/19 1032  NA 138  K 3.9  CL 109  CO2 20*  GLUCOSE 108*  BUN 29*  CREATININE 1.12  CALCIUM 8.5*  GFRNONAA 57*  GFRAA >60  ANIONGAP 9    No results for input(s): PROT, ALBUMIN, AST, ALT, ALKPHOS, BILITOT in the last 168 hours. Hematology Recent Labs  Lab 01/12/19 1032  WBC 5.6  RBC 4.11*  HGB 13.3  HCT 41.3  MCV 100.5*  MCH 32.4  MCHC 32.2  RDW 13.7  PLT 204   BNPNo results for input(s): BNP, PROBNP in the last 168 hours.  DDimer  Recent Labs  Lab 01/12/19 1332  DDIMER 1.24*     Radiology/Studies:  Dg Chest 2 View  Result Date: 01/12/2019 CLINICAL DATA:  Chest pain.  History of chronic pneumonia. EXAM: CHEST - 2 VIEW COMPARISON:  May 05, 2016 FINDINGS: Mild atelectasis in the left base. The heart, hila, mediastinum, lungs, and pleura are normal. IMPRESSION: No active cardiopulmonary disease. Electronically Signed   By: Dorise Bullion III M.D   On: 01/12/2019 10:54    Assessment and Plan:   1. Chest pain: His symptoms are quite concerning for an acute coronary event and his chest discomfort was nitroglycerin responsive.  There are however, no high risk ECG or biomarker findings.  We will go ahead with a Lexiscan Myoview tomorrow morning.  He underwent a PE protocol CT of the chest this afternoon and I would like to avoid  another iodinated contrast based procedure within 24 hours.  Check lipid profile. 2. Trifascicular block: Also appears to have brief episodes of second-degree AV block Mobitz type I with very long cycles, but no meaningful bradycardia or true pauses.  If no other explains were symptoms is found would recommend long term arrhythmia monitoring to make sure that his symptoms are not related to extreme bradycardia from third-degree AV block.  Avoid beta-blockers and other medications with negative chronotropic effect.       For questions or updates, please contact [place] Please consult [email or web address] for contact info under     Signed, Sanda Klein, MD  01/12/2019 5:30 PM

## 2019-01-12 NOTE — ED Provider Notes (Signed)
Emergency Department Provider Note   I have reviewed the triage vital signs and the nursing notes.   HISTORY  Chief Complaint Chest Pain   HPI Jacob Kirk is a 85 y.o. male with PMH of CAD and HTN presents to the emergency department for evaluation of acute onset shortness of breath with weakness and circumferential chest tightness.  Patient states that symptoms began suddenly and did not improve with his home albuterol.  Ultimately, the patient's son called EMS who arrived to give nitroglycerin and 324 mg aspirin.  Patient states that after that his symptoms improved and he is not currently having any tightness, shortness of breath, or other symptoms.  He has not had similar symptoms in the past.  He notes a prior history of a "silent heart attack" but no PCI or CABG required. No fever. Denies chills.    Past Medical History:  Diagnosis Date  . Bronchitis   . Coronary artery disease   . Spinal stenosis     There are no active problems to display for this patient.   Past Surgical History:  Procedure Laterality Date  . JOINT REPLACEMENT      Allergies Patient has no known allergies.  No family history on file.  Social History Social History   Tobacco Use  . Smoking status: Never Smoker  . Smokeless tobacco: Never Used  Substance Use Topics  . Alcohol use: No  . Drug use: No    Review of Systems  Constitutional: No fever/chills Eyes: No visual changes. ENT: No sore throat. Cardiovascular: Positive chest pain. Respiratory: Positive shortness of breath. Gastrointestinal: No abdominal pain.  No nausea, no vomiting.  No diarrhea.  No constipation. Genitourinary: Negative for dysuria. Musculoskeletal: Negative for back pain. Skin: Negative for rash. Neurological: Negative for headaches, focal weakness or numbness.  10-point ROS otherwise negative.  ____________________________________________   PHYSICAL EXAM:  VITAL SIGNS: ED Triage Vitals [01/12/19  1025]  Enc Vitals Group     BP 118/68     Pulse Rate 90     Resp 16     Temp 98 F (36.7 C)     Temp Source Oral     SpO2 98 %   Constitutional: Alert and oriented. Well appearing and in no acute distress. Eyes: Conjunctivae are normal. Head: Atraumatic. Nose: No congestion/rhinnorhea. Mouth/Throat: Mucous membranes are moist.  Oropharynx non-erythematous. Neck: No stridor.   Cardiovascular: Normal rate, regular rhythm. Good peripheral circulation. Grossly normal heart sounds.   Respiratory: Normal respiratory effort.  No retractions. Lungs CTAB. Gastrointestinal: Soft and nontender. No distention.  Musculoskeletal: No lower extremity tenderness nor edema. No gross deformities of extremities. Neurologic:  Normal speech and language. No gross focal neurologic deficits are appreciated.  Skin:  Skin is warm, dry and intact. No rash noted.  ____________________________________________   LABS (all labs ordered are listed, but only abnormal results are displayed)  Labs Reviewed  BASIC METABOLIC PANEL - Abnormal; Notable for the following components:      Result Value   CO2 20 (*)    Glucose, Bld 108 (*)    BUN 29 (*)    Calcium 8.5 (*)    GFR calc non Af Amer 57 (*)    All other components within normal limits  CBC - Abnormal; Notable for the following components:   RBC 4.11 (*)    MCV 100.5 (*)    All other components within normal limits  D-DIMER, QUANTITATIVE (NOT AT [facility]) - Abnormal; Notable for the following  components:   D-Dimer, Quant 1.24 (*)    All other components within normal limits  SARS CORONAVIRUS 2 (TAT 6-24 HRS)  TROPONIN I (HIGH SENSITIVITY)  TROPONIN I (HIGH SENSITIVITY)   ____________________________________________  EKG   EKG Interpretation  Date/Time:  Tuesday January 12 2019 10:23:52 EDT Ventricular Rate:  90 PR Interval:  242 QRS Duration: 124 QT Interval:  400 QTC Calculation: 489 R Axis:   -55 Text Interpretation:  Sinus rhythm with  1st degree A-V block Right bundle branch block Left anterior fascicular block Left ventricular hypertrophy with QRS widening and repolarization abnormality Anterolateral infarct , age undetermined Abnormal ECG No STEMI  Confirmed by Nanda Quinton [id number]) on 01/12/2019 1:11:12 PM       ____________________________________________  RADIOLOGY  Dg Chest 2 View  Result Date: 01/12/2019 CLINICAL DATA:  Chest pain.  History of chronic pneumonia. EXAM: CHEST - 2 VIEW COMPARISON:  May 05, 2016 FINDINGS: Mild atelectasis in the left base. The heart, hila, mediastinum, lungs, and pleura are normal. IMPRESSION: No active cardiopulmonary disease. Electronically Signed   By: Dorise Bullion III M.D   On: 01/12/2019 10:54    ____________________________________________   PROCEDURES  Procedure(s) performed:   Procedures  None  ____________________________________________   INITIAL IMPRESSION / ASSESSMENT AND PLAN / ED COURSE  Pertinent labs & imaging results that were available during my care of the patient were reviewed by me and considered in my medical decision making (see chart for details).   Patient presents to the emergency department for evaluation of acute onset shortness of breath, chest tightness.  Concern for CAD is elevated.  Initial labs from triage show troponin of only 4.  EKG shows bifascicular block with no acute ischemic changes. HEART score 6.  I am adding a d-dimer but low suspicion for PE clinically.  Patient does have history of CAD.  Had shared decision making conversation with the patient and son at bedside believe that obs admit at this point would be best.   Labs evaluated.  Troponin is low.  Chest x-ray reviewed.  Given patient's elevated HEART score I spoke with Dr. Jamse Arn with [facility].  I have added a d-dimer and will plan to follow-up on that.  If the age-adjusted d-dimer is elevated I will send for CTA.   Discussed patient's case with [facility] to request admission.  Patient and family (if present) updated with plan. Care transferred to [facility] service.  I reviewed all nursing notes, vitals, pertinent old records, EKGs, labs, imaging (as available).  D-dimer elevated to 1.24. Have ordered CTA. Continue with admit plan.   ____________________________________________  FINAL CLINICAL IMPRESSION(S) / ED DIAGNOSES  Final diagnoses:  Precordial chest pain    MEDICATIONS GIVEN DURING THIS VISIT:  Medications  sodium chloride flush (NS) 0.9 % injection 3 mL (has no administration in time range)    Note:  This document was prepared using Dragon voice recognition software and may include unintentional dictation errors.  Nanda Quinton, MD, [facility] Emergency Medicine    Ramiyah Mcclenahan, [facility], MD 01/12/19 [phone]

## 2019-01-12 NOTE — ED Triage Notes (Signed)
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 Pt states the pain last until ems came and gave him something to put under his tongue which helped the pain. Pt states his pain is better but now just feels weak and lightheaded. Pt also took 324 asa with ems.   Pt has 18 G in left a/c by ems and was given 600cc ml

## 2019-01-13 ENCOUNTER — Observation Stay (HOSPITAL_BASED_OUTPATIENT_CLINIC_OR_DEPARTMENT_OTHER): Payer: Medicare Other

## 2019-01-13 ENCOUNTER — Other Ambulatory Visit: Payer: Self-pay | Admitting: Medical

## 2019-01-13 DIAGNOSIS — I453 Trifascicular block: Secondary | ICD-10-CM | POA: Diagnosis not present

## 2019-01-13 DIAGNOSIS — R079 Chest pain, unspecified: Secondary | ICD-10-CM

## 2019-01-13 DIAGNOSIS — R072 Precordial pain: Secondary | ICD-10-CM

## 2019-01-13 DIAGNOSIS — R55 Syncope and collapse: Secondary | ICD-10-CM

## 2019-01-13 DIAGNOSIS — I200 Unstable angina: Secondary | ICD-10-CM | POA: Diagnosis not present

## 2019-01-13 LAB — ECHOCARDIOGRAM COMPLETE
Height: 67 in
Weight: 2761.92 oz

## 2019-01-13 LAB — LIPID PANEL
Cholesterol: 221 mg/dL — ABNORMAL HIGH (ref 0–200)
HDL: 39 mg/dL — ABNORMAL LOW (ref >40)
Total CHOL/HDL Ratio: 5.7 RATIO
Triglycerides: 96 mg/dL (ref <150)
VLDL: 19 mg/dL (ref 0–40)

## 2019-01-13 LAB — NM MYOCAR MULTI W/SPECT W/WALL MOTION / EF
Estimated workload: 1 METS
Exercise duration (min): 7 min
Exercise duration (sec): 15 s
MPHR: 128 {beats}/min
Peak HR: 105 {beats}/min
Percent HR: 82 %
Rest HR: 85 {beats}/min

## 2019-01-13 LAB — TROPONIN I (HIGH SENSITIVITY): Troponin I (High Sensitivity): 6 ng/L (ref <18)

## 2019-01-13 MED ORDER — REGADENOSON 0.4 MG/5ML IV SOLN
INTRAVENOUS | Status: AC
  Filled 2019-01-13: qty 5

## 2019-01-13 MED ORDER — ATORVASTATIN CALCIUM 40 MG PO TABS: 40.0000 mg | ORAL_TABLET | Freq: Every day | ORAL | Status: DC

## 2019-01-13 MED ORDER — REGADENOSON 0.4 MG/5ML IV SOLN
0.4000 mg | Freq: Once | INTRAVENOUS | Status: AC
  Administered 2019-01-13: 0.4 mg via INTRAVENOUS

## 2019-01-13 MED ORDER — PERFLUTREN LIPID MICROSPHERE
1.0000 mL | INTRAVENOUS | Status: DC | PRN
  Administered 2019-01-13: 4 mL via INTRAVENOUS
  Filled 2019-01-13: qty 10

## 2019-01-13 MED ORDER — ASPIRIN 81 MG PO TBEC: 81.0000 mg | DELAYED_RELEASE_TABLET | Freq: Every day | ORAL | 0 refills | Status: AC

## 2019-01-13 MED ORDER — TECHNETIUM TC 99M TETROFOSMIN IV KIT
30.0000 | PACK | Freq: Once | INTRAVENOUS | Status: AC | PRN
  Administered 2019-01-13: 30 via INTRAVENOUS

## 2019-01-13 MED ORDER — TECHNETIUM TC 99M TETROFOSMIN IV KIT
10.0000 | PACK | Freq: Once | INTRAVENOUS | Status: AC | PRN
  Administered 2019-01-13: 10 via INTRAVENOUS

## 2019-01-13 MED ORDER — ATORVASTATIN CALCIUM 40 MG PO TABS: 40.0000 mg | ORAL_TABLET | Freq: Every day | ORAL | 0 refills | Status: DC

## 2019-01-13 MED ORDER — NITROGLYCERIN 0.4 MG SL SUBL: 0.4000 mg | SUBLINGUAL_TABLET | SUBLINGUAL | 3 refills | Status: AC | PRN

## 2019-01-13 NOTE — Progress Notes (Addendum)
Reviewed echo at bedside. Normal right and left ventricular function and normal regional wall motion. Nuclear imaging does not show a meaningful area of ischemia. There may be a small area of very mild inferolateral ischemia, but no defect corresponding to the large calcified atheroma in the LAD artery. Not sure what caused his complaint ( Esophageal spasm? Coronary spasm? Transient 3rd degree AV block?). Recommend outpatient 30 day event monitor and subsequent f/u. NTG SL prn and ASA 81 mg daily [facility] will sign off.   Medication Recommendations:  NTG SL prn,  ASA 81 mg daily, atorvastatin 40 mg daily Other recommendations (labs, testing, etc):  30 day event monitor Follow up as an outpatient:  Will arrange f/u 6-8 weeks  Sanda Klein, MD, [facility] [phone] office [phone] pager

## 2019-01-13 NOTE — Progress Notes (Signed)
Pt tolerating procedure well. He states that he is having some "lateral chest discomfort around his thoracic arch." Vitals are stable at this time, PA at bedside for test

## 2019-01-13 NOTE — Progress Notes (Signed)
RN went over discharge instructions with the patient he stated understanding. Pt son was at bedside and did not have any questions, 3 prescriptions escribed to pharmacy. The IV has been removed and pt has been dressed belongings packed and is ready for discharge

## 2019-01-13 NOTE — Discharge Summary (Addendum)
Physician Discharge Summary  Jacob Kirk GNF:[id number] DOB: 1925/03/30 DOA: 01/12/2019  PCP: Jacob Harries, MD  Admit date: 01/12/2019 Discharge date: 01/13/2019  Admitted From: Home Disposition: Home  Recommendations for Outpatient Follow-up:  1. 30 day event monitor  Discharge Condition:  stable   CODE STATUS:  DNR   Diet recommendation:  stable Consultations:  cardiology    Discharge Diagnoses:  Principal Problem:   Chest pain Active Problems:   Glaucoma    Brief Summary: Jacob Kirk is an 85 y.o. male with past medical history significant for coronary artery disease which he describes as "a silent heart attack which occurred about 6 years ago".It sounds like patient may also have a history of perhaps a regurgitant valve which he describes as "the third chamber of my heart was pushing the blood backward which is why I had the silent heart attack".  Patient used to be followed at [facility] in [place] for his cardiac care.   Patient states he was in his usual state of good health until earlier today when he developed a sensation of shortness of breath while at rest and weakness.  This was subsequently followed by sensation of "my diaphragm being pushed up into my thorax".  He then developed a sensation of pressure and squeezing in his chest and profound weakness of his arms as such that he was unable to hold his Ventolin inhaler.  Patient felt very weak and thought he was going to pass out however he did not.  Patient was alone and by himself and was unable to ask for help until about an hour later when somebody passed by and he asked for help.  Patient states the symptoms lasted about an hour and a half until EMS arrived.  He was given nitroglycerin under his tongue but he thinks the chest discomfort had already worn off before he got the nitroglycerin.  At present patient states he feels back to baseline although may be a little weak.  He denied any nausea  or vomiting during that episode.  He denied any profuse diaphoresis did feel a little clammy.  Patient denies any recent fevers or chills.  No new cough.  No nausea vomiting or diarrhea.  No unexplained weight loss.  Hospital Course:   Chest pain and trifascicular block -CTA negative for aortic dissection - The patient is undergone a Myoview stress test which is is negative and 2D echo is unrevealing per cardiology (official results are still pending) -Neurology has recommended a 30-day event monitor, aspirin 81 mg daily, and atorvastatin 40 mg daily and nitroglycerin sublingual as needed which I have prescribed  Hyperlipidemia -Cholesterol level is 221 HDL is 39 and LDL was not calculated -Lipitor has been initiated  Discharge Exam: Vitals:   01/13/19 1047 01/13/19 1423  BP: (!) 122/105 (!) 162/87  Pulse: 90 95  Resp: 16 18  Temp: 98.1 F (36.7 C) 98.4 F (36.9 C)  SpO2: 97% 97%   Vitals:   01/13/19 0946 01/13/19 0948 01/13/19 1047 01/13/19 1423  BP: 117/60 130/63 (!) 122/105 (!) 162/87  Pulse: 100 99 90 95  Resp:   16 18  Temp:   98.1 F (36.7 C) 98.4 F (36.9 C)  TempSrc:   Oral Oral  SpO2:   97% 97%  Weight:      Height:        General: Pt is alert, awake, not in acute distress Cardiovascular: RRR, S1/S2 +, no rubs, no gallops Respiratory: CTA bilaterally, no  wheezing, no rhonchi Abdominal: Soft, NT, ND, bowel sounds + Extremities: no edema, no cyanosis   Discharge Instructions  Discharge Instructions    Diet - low sodium heart healthy   Complete by: As directed    Increase activity slowly   Complete by: As directed      Allergies as of 01/13/2019   No Known Allergies     Medication List    STOP taking these medications   ibuprofen 200 MG tablet Commonly known as: ADVIL     TAKE these medications   aspirin 81 MG EC tablet Take 1 tablet (81 mg total) by mouth daily. Start taking on: January 14, 2019   atorvastatin 40 MG tablet Commonly known as:  LIPITOR Take 1 tablet (40 mg total) by mouth daily at 6 PM.   diphenhydrAMINE 25 MG tablet Commonly known as: BENADRYL Take 75 mg by mouth at bedtime.   Lumigan 0.01 % Soln Generic drug: bimatoprost Place 1 drop into both eyes daily.   nitroGLYCERIN 0.4 MG SL tablet Commonly known as: Nitrostat Place 1 tablet (0.4 mg total) under the tongue every 5 (five) minutes as needed for chest pain.   OVER THE COUNTER MEDICATION Take by mouth daily. CBD Oil   Pataday 0.1 % ophthalmic solution Generic drug: olopatadine Place 1 drop into both eyes 2 (two) times daily.   Xiidra 5 % Soln Generic drug: Lifitegrast Place 1 drop into both eyes 2 (two) times daily.       No Known Allergies   Procedures/Studies: Stress test 2 D ECHO  Dg Chest 2 View  Result Date: 01/12/2019 CLINICAL DATA:  Chest pain.  History of chronic pneumonia. EXAM: CHEST - 2 VIEW COMPARISON:  May 05, 2016 FINDINGS: Mild atelectasis in the left base. The heart, hila, mediastinum, lungs, and pleura are normal. IMPRESSION: No active cardiopulmonary disease. Electronically Signed   By: Gerome Samavid  Williams III M.D   On: 01/12/2019 10:54   Ct Angio Chest Pe W And/or Wo Contrast  Result Date: 01/12/2019 CLINICAL DATA:  Ct pe chest 70ml omn 350, Pt states the pain last until ems came and gave him something to put under his tongue which helped the pain. Pt states his pain is better but now just feels weak and lightheaded. Pt also took 324 asa ^12800mL OMNIPAQUE IOHEXOL 300 MG/ML SOLNPE suspected, intermediate prob, positive D-dimer EXAM: CT ANGIOGRAPHY CHEST WITH CONTRAST TECHNIQUE: Multidetector CT imaging of the chest was performed using the standard protocol during bolus administration of intravenous contrast. Multiplanar CT image reconstructions and MIPs were obtained to evaluate the vascular anatomy. CONTRAST:  100mL OMNIPAQUE IOHEXOL 300 MG/ML  SOLN COMPARISON:  None. FINDINGS: Cardiovascular: No filling defects within the  pulmonary arteries to suggest acute pulmonary embolism. No acute findings of the aorta or great vessels. No pericardial fluid. Mediastinum/Nodes: No axillary supraclavicular adenopathy. No mediastinal hilar adenopathy no pericardial effusion. Esophagus normal. Subcarinal calcified nodes. Lungs/Pleura: No pulmonary infarction. Scattered calcified granulomas. Mild ground-glass regions within the lower and upper lobes. Upper Abdomen: Limited view of the liver, kidneys, pancreas are unremarkable. Normal adrenal glands. Musculoskeletal: No acute osseous abnormality. Review of the MIP images confirms the above findings. IMPRESSION: . 1. No acute pulmonary embolism 2. Diffuse ground-glass opacity in the lung representing atelectasis or mild pulmonary edema. 3. Prior granulomatous disease with calcified mediastinal lymph nodes and scattered calcified pulmonary granulomas. Electronically Signed   By: Genevive BiStewart  Edmunds M.D.   On: 01/12/2019 17:54     The results of significant  diagnostics from this hospitalization (including imaging, microbiology, ancillary and laboratory) are listed below for reference.     Microbiology: Recent Results (from the past 240 hour(s))  SARS CORONAVIRUS 2 (TAT 6-24 HRS) Nasopharyngeal Nasopharyngeal Swab     Status: None   Collection Time: 01/12/19  2:00 PM   Specimen: Nasopharyngeal Swab  Result Value Ref Range Status   SARS Coronavirus 2 NEGATIVE NEGATIVE Final    Comment: (NOTE) SARS-CoV-2 target nucleic acids are NOT DETECTED. The SARS-CoV-2 RNA is generally detectable in upper and lower respiratory specimens during the acute phase of infection. Negative results do not preclude SARS-CoV-2 infection, do not rule out co-infections with other pathogens, and should not be used as the sole basis for treatment or other patient management decisions. Negative results must be combined with clinical observations, patient history, and epidemiological information. The expected result  is Negative. Fact Sheet for Patients: HairSlick.no Fact Sheet for Healthcare Providers: quierodirigir.com This test is not yet approved or cleared by the [place] FDA and  has been authorized for detection and/or diagnosis of SARS-CoV-2 by FDA under an Emergency Use Authorization (EUA). This EUA will remain  in effect (meaning this test can be used) for the duration of the COVID-19 declaration under Section 56 4(b)(1) of the Act, 21 U.S.C. section 360bbb-3(b)(1), unless the authorization is terminated or revoked sooner. Performed at [facility], [address], [place], [place] [address]      Labs: BNP (last 3 results) No results for input(s): BNP in the last 8760 hours. Basic Metabolic Panel: Recent Labs  Lab 01/12/19 1032 01/12/19 2055  NA 138  --   K 3.9  --   CL 109  --   CO2 20*  --   GLUCOSE 108*  --   BUN 29*  --   CREATININE 1.12 0.98  CALCIUM 8.5*  --    Liver Function Tests: No results for input(s): AST, ALT, ALKPHOS, BILITOT, PROT, ALBUMIN in the last 168 hours. No results for input(s): LIPASE, AMYLASE in the last 168 hours. No results for input(s): AMMONIA in the last 168 hours. CBC: Recent Labs  Lab 01/12/19 1032 01/12/19 2055  WBC 5.6 4.2  HGB 13.3 13.5  HCT 41.3 39.6  MCV 100.5* 96.1  PLT 204 182   Cardiac Enzymes: No results for input(s): CKTOTAL, CKMB, CKMBINDEX, TROPONINI in the last 168 hours. BNP: Invalid input(s): POCBNP CBG: No results for input(s): GLUCAP in the last 168 hours. D-Dimer Recent Labs    01/12/19 1332  DDIMER 1.24*   Hgb A1c No results for input(s): HGBA1C in the last 72 hours. Lipid Profile Recent Labs    01/13/19 0227  CHOL 221*  HDL 39*  LDLCALC NOT CALCULATED  TRIG 96  CHOLHDL 5.7   Thyroid function studies No results for input(s): TSH, T4TOTAL, T3FREE, THYROIDAB in the last 72 hours.  Invalid input(s): FREET3 Anemia work up No results  for input(s): VITAMINB12, FOLATE, FERRITIN, TIBC, IRON, RETICCTPCT in the last 72 hours. Urinalysis    Component Value Date/Time   COLORURINE AMBER BIOCHEMICALS MAY BE AFFECTED BY COLOR (A) 11/03/2009 0955   APPEARANCEUR CLOUDY (A) 11/03/2009 0955   LABSPEC 1.027 11/03/2009 0955   PHURINE 5.5 11/03/2009 0955   GLUCOSEU NEGATIVE 11/03/2009 0955   HGBUR NEGATIVE 11/03/2009 0955   BILIRUBINUR NEGATIVE 11/03/2009 0955   KETONESUR 15 (A) 11/03/2009 0955   PROTEINUR NEGATIVE 11/03/2009 0955   UROBILINOGEN 1.0 11/03/2009 0955   NITRITE NEGATIVE 11/03/2009 0955   LEUKOCYTESUR  11/03/2009 0955    NEGATIVE MICROSCOPIC NOT DONE ON URINES WITH NEGATIVE PROTEIN, BLOOD, LEUKOCYTES, NITRITE, OR GLUCOSE <1000 mg/dL.   Sepsis Labs Invalid input(s): PROCALCITONIN,  WBC,  LACTICIDVEN Microbiology Recent Results (from the past 240 hour(s))  SARS CORONAVIRUS 2 (TAT 6-24 HRS) Nasopharyngeal Nasopharyngeal Swab     Status: None   Collection Time: 01/12/19  2:00 PM   Specimen: Nasopharyngeal Swab  Result Value Ref Range Status   SARS Coronavirus 2 NEGATIVE NEGATIVE Final    Comment: (NOTE) SARS-CoV-2 target nucleic acids are NOT DETECTED. The SARS-CoV-2 RNA is generally detectable in upper and lower respiratory specimens during the acute phase of infection. Negative results do not preclude SARS-CoV-2 infection, do not rule out co-infections with other pathogens, and should not be used as the sole basis for treatment or other patient management decisions. Negative results must be combined with clinical observations, patient history, and epidemiological information. The expected result is Negative. Fact Sheet for Patients: SugarRoll.be Fact Sheet for Healthcare Providers: https://www.woods-mathews.com/ This test is not yet approved or cleared by the [place] FDA and  has been authorized for detection and/or diagnosis of SARS-CoV-2 by FDA under an  Emergency Use Authorization (EUA). This EUA will remain  in effect (meaning this test can be used) for the duration of the COVID-19 declaration under Section 56 4(b)(1) of the Act, 21 U.S.C. section 360bbb-3(b)(1), unless the authorization is terminated or revoked sooner. Performed at [facility], [place] [address] [place], [place] [address]      Time coordinating discharge in minutes: 65  SIGNED:   Debbe Odea, MD  [facility] 01/13/2019, 3:13 PM Pager   If 7PM-7AM, please contact night-coverage [email or web address] Password [email or web address]

## 2019-01-13 NOTE — Progress Notes (Signed)
Progress Note  Patient Name: Jacob Kirk Date of Encounter: 01/13/2019  Primary Cardiologist: New (Willamae Demby)  Subjective   No new problems with chest discomfort overnight. Has back pain and sciatica.  Inpatient Medications    Scheduled Meds: . aspirin EC  81 mg Oral Daily  . diphenhydrAMINE  25 mg Oral QHS  . enoxaparin (LOVENOX) injection  40 mg Subcutaneous Q24H  . latanoprost  1 drop Both Eyes QHS  . Lifitegrast  1 drop Both Eyes BID  . olopatadine  1 drop Both Eyes BID  . sodium chloride flush  3 mL Intravenous Once   Continuous Infusions:  PRN Meds: acetaminophen, ondansetron (ZOFRAN) IV   Vital Signs    Vitals:   01/12/19 1700 01/12/19 2021 01/13/19 0020 01/13/19 0450  BP: (!) 141/81 (!) 159/81 (!) 143/74 129/83  Pulse: 85 91 89 90  Resp: (!) 24 20 16 18   Temp:  97.9 F (36.6 C)  (!) 97.4 F (36.3 C)  TempSrc:  Oral  Oral  SpO2: 99% 99% 97% 94%  Weight:  78.3 kg    Height:  5\' 7"  (1.702 m)     No intake or output data in the 24 hours ending 01/13/19 0915 Last 3 Weights 01/12/2019 05/05/2016  Weight (lbs) 172 lb 9.9 oz 180 lb  Weight (kg) 78.3 kg 81.647 kg      Telemetry    Occasional "dropped beats".  On current tracings these appear more consistent with a blocked PACs than second-degree AV block - Personally Reviewed  ECG    No new tracing- Personally Reviewed  Physical Exam  Appears well GEN: No acute distress.   Neck: No JVD Cardiac: RRR, no murmurs, rubs, or gallops.  Respiratory: Clear to auscultation bilaterally. GI: Soft, nontender, non-distended  MS: No edema; No deformity. Neuro:  Nonfocal  Psych: Normal affect   Labs    High Sensitivity Troponin:   Recent Labs  Lab 01/12/19 1032 01/12/19 1226  TROPONINIHS 4 5      Chemistry Recent Labs  Lab 01/12/19 1032 01/12/19 2055  NA 138  --   K 3.9  --   CL 109  --   CO2 20*  --   GLUCOSE 108*  --   BUN 29*  --   CREATININE 1.12 0.98  CALCIUM 8.5*  --   GFRNONAA 57*  >60  GFRAA >60 >60  ANIONGAP 9  --      Hematology Recent Labs  Lab 01/12/19 1032 01/12/19 2055  WBC 5.6 4.2  RBC 4.11* 4.12*  HGB 13.3 13.5  HCT 41.3 39.6  MCV 100.5* 96.1  MCH 32.4 32.8  MCHC 32.2 34.1  RDW 13.7 13.6  PLT 204 182    BNPNo results for input(s): BNP, PROBNP in the last 168 hours.   DDimer  Recent Labs  Lab 01/12/19 1332  DDIMER 1.24*     Radiology    Dg Chest 2 View  Result Date: 01/12/2019 CLINICAL DATA:  Chest pain.  History of chronic pneumonia. EXAM: CHEST - 2 VIEW COMPARISON:  May 05, 2016 FINDINGS: Mild atelectasis in the left base. The heart, hila, mediastinum, lungs, and pleura are normal. IMPRESSION: No active cardiopulmonary disease. Electronically Signed   By: 01/14/2019 III M.D   On: 01/12/2019 10:54   Ct Angio Chest Pe W And/or Wo Contrast  Result Date: 01/12/2019 CLINICAL DATA:  Ct pe chest 51ml omn 350, Pt states the pain last until ems came and gave him something to put  under his tongue which helped the pain. Pt states his pain is better but now just feels weak and lightheaded. Pt also took 324 asa ^159mL OMNIPAQUE IOHEXOL 300 MG/ML SOLNPE suspected, intermediate prob, positive D-dimer EXAM: CT ANGIOGRAPHY CHEST WITH CONTRAST TECHNIQUE: Multidetector CT imaging of the chest was performed using the standard protocol during bolus administration of intravenous contrast. Multiplanar CT image reconstructions and MIPs were obtained to evaluate the vascular anatomy. CONTRAST:  170mL OMNIPAQUE IOHEXOL 300 MG/ML  SOLN COMPARISON:  None. FINDINGS: Cardiovascular: No filling defects within the pulmonary arteries to suggest acute pulmonary embolism. No acute findings of the aorta or great vessels. No pericardial fluid. Mediastinum/Nodes: No axillary supraclavicular adenopathy. No mediastinal hilar adenopathy no pericardial effusion. Esophagus normal. Subcarinal calcified nodes. Lungs/Pleura: No pulmonary infarction. Scattered calcified granulomas.  Mild ground-glass regions within the lower and upper lobes. Upper Abdomen: Limited view of the liver, kidneys, pancreas are unremarkable. Normal adrenal glands. Musculoskeletal: No acute osseous abnormality. Review of the MIP images confirms the above findings. IMPRESSION: . 1. No acute pulmonary embolism 2. Diffuse ground-glass opacity in the lung representing atelectasis or mild pulmonary edema. 3. Prior granulomatous disease with calcified mediastinal lymph nodes and scattered calcified pulmonary granulomas. Electronically Signed   By: Suzy Bouchard M.D.   On: 01/12/2019 17:54    Cardiac Studies   Although not reported on chest CT angiography, there is heavy atherosclerotic calcification in the coronary arteries particularly in the proximal-mid LAD artery and throughout the AV groove left circumflex distribution.  Patient Profile     85 y.o. male presenting with chest pain highly concerning for unstable angina, but without high risk features on ECG or biomarkers, extensive atherosclerotic calcification in the coronary arteries on CT, trifascicular block.  Assessment & Plan    1. [place]: For [employer] today.  Plan cardiac catheterization later today or tomorrow if there is evidence of extensive ischemia.  At this point no clear indication to start intravenous anticoagulant.  He has moderate to severely elevated LDL cholesterol and despite his age it is probably appropriate to start statin therapy, regardless of the findings on the Myoview. 2.  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  Pauses in last 24 hours appear more consistent with blocked premature atrial beats rather than second-degree atrioventricular block Mobitz type I.  Continue telemetry.     For questions or updates, please contact [place] Please consult [email or web address] for contact info under        Signed, Sanda Klein, MD  01/13/2019, 9:15 AM

## 2019-01-13 NOTE — Progress Notes (Signed)
   Philis Nettle presented for a nuclear stress test today.  No immediate complications.  Stress imaging is pending at this time.  Preliminary EKG findings may be listed in the chart, but the stress test result will not be finalized until perfusion imaging is complete.  1 day study, [facility] to read.  Kathyrn Drown, NP-C 01/13/2019, 10:15 AM

## 2019-01-21 ENCOUNTER — Telehealth: Payer: Self-pay | Admitting: *Deleted

## 2019-01-21 NOTE — Telephone Encounter (Signed)
LMVM call [phone] to set up cardiac event monitor.

## 2019-02-01 ENCOUNTER — Telehealth: Payer: Self-pay | Admitting: Cardiovascular Disease

## 2019-02-01 NOTE — Telephone Encounter (Signed)
The patient has been added to Dr. Victorino December schedule on 10/23. The son has been made aware and verbalized his understanding.

## 2019-02-01 NOTE — Telephone Encounter (Signed)
New message:      Patient nephew calling and would like to get a sooner appt. Patient has been out the of hospital since 01/14/19 and there is not sooner. Please call back.

## 2019-02-05 ENCOUNTER — Other Ambulatory Visit: Payer: Self-pay

## 2019-02-05 ENCOUNTER — Encounter: Payer: Self-pay | Admitting: Cardiovascular Disease

## 2019-02-05 ENCOUNTER — Ambulatory Visit: Payer: Medicare Other | Admitting: Cardiovascular Disease

## 2019-02-05 VITALS — BP 137/72 | HR 95 | Temp 97.2°F | Ht 67.0 in | Wt 177.8 lb

## 2019-02-05 DIAGNOSIS — I10 Essential (primary) hypertension: Secondary | ICD-10-CM | POA: Diagnosis not present

## 2019-02-05 DIAGNOSIS — R072 Precordial pain: Secondary | ICD-10-CM | POA: Diagnosis not present

## 2019-02-05 DIAGNOSIS — I441 Atrioventricular block, second degree: Secondary | ICD-10-CM | POA: Diagnosis not present

## 2019-02-05 DIAGNOSIS — E7800 Pure hypercholesterolemia, unspecified: Secondary | ICD-10-CM

## 2019-02-05 NOTE — Progress Notes (Signed)
Cardiology Office Note:    Date:  02/05/2019   ID:  Jacob Kirk, DOB 09-Feb-1925, MRN [id number]  PCP:  Tracey Harries, MD  Cardiologist:  No primary care provider on file.  Electrophysiologist:  None   Referring MD: Tracey Harries, MD   Chief Complaint  Patient presents with  . Follow-up    Arrhythmia, chest pain    History of Present Illness:    Jacob Kirk is a 85 y.o. male with a hx of recently diagnosed second-degree AV block, Mobitz type I during a hospitalization for atypical chest/epigastric discomfort.  During that hospital stay he had a an echocardiogram with normal findings and a nuclear stress test with a questionable inferolateral area of mixed reversible/fixed defect (in my opinion there was no evidence of ischemic abnormalities).  No evidence of pulmonary embolism by CT angiography, but there was heavy atherosclerotic calcification seen in the coronary arteries, particularly in the proximal-mid LAD and throughout the AV groove segment of the left circumflex coronary artery.  He has not had any meaningful chest symptoms since hospital discharge.  The patient specifically denies any chest pain at rest exertion, dyspnea at rest or with exertion, orthopnea, paroxysmal nocturnal dyspnea, syncope, palpitations, focal neurological deficits, intermittent claudication, lower extremity edema, unexplained weight gain, cough, hemoptysis or wheezing.  He is able to perform activities of daily living and walk around his son's workshop without any limitations.   Past Medical History:  Diagnosis Date  . Bronchitis   . Coronary artery disease   . Spinal stenosis     Past Surgical History:  Procedure Laterality Date  . APPENDECTOMY    . JOINT REPLACEMENT      Current Medications: Current Meds  Medication Sig  . aspirin EC 81 MG EC tablet Take 1 tablet (81 mg total) by mouth daily.  Marland Kitchen atorvastatin (LIPITOR) 40 MG tablet Take 1 tablet (40 mg total) by mouth daily at 6 PM.   . diphenhydrAMINE (BENADRYL) 25 MG tablet Take 75 mg by mouth at bedtime.  Marland Kitchen LUMIGAN 0.01 % SOLN Place 1 drop into both eyes daily.  . nitroGLYCERIN (NITROSTAT) 0.4 MG SL tablet Place 1 tablet (0.4 mg total) under the tongue every 5 (five) minutes as needed for chest pain.  Marland Kitchen OVER THE COUNTER MEDICATION Take by mouth daily. CBD Oil  . XIIDRA 5 % SOLN Place 1 drop into both eyes 2 (two) times daily.     Allergies:   Patient has no known allergies.   Social History   Socioeconomic History  . Marital status: Married    Spouse name: Not on file  . Number of children: Not on file  . Years of education: Not on file  . Highest education level: Not on file  Occupational History  . Not on file  Social Needs  . Financial resource strain: Not on file  . Food insecurity    Worry: Not on file    Inability: Not on file  . Transportation needs    Medical: Not on file    Non-medical: Not on file  Tobacco Use  . Smoking status: Never Smoker  . Smokeless tobacco: Never Used  Substance and Sexual Activity  . Alcohol use: No  . Drug use: No  . Sexual activity: Not on file  Lifestyle  . Physical activity    Days per week: Not on file    Minutes per session: Not on file  . Stress: Not on file  Relationships  . Social connections  Talks on phone: Not on file    Gets together: Not on file    Attends religious service: Not on file    Active member of club or organization: Not on file    Attends meetings of clubs or organizations: Not on file    Relationship status: Not on file  Other Topics Concern  . Not on file  Social History Narrative  . Not on file     Family History: The patient's family history is significantly negative for premature Cardiac/vascular disease or arrhythmia  ROS:   Please see the history of present illness.     All other systems reviewed and are negative.  EKGs/Labs/Other Studies Reviewed:    The following studies were reviewed today: Echo cardiogram,  nuclear stress test  EKG:  EKG is not ordered today.  ECG in the hospital showed trifascicular block (long PR interval, right bundle branch block, left anterior fascicular block).  Recent Labs: 01/12/2019: BUN 29; Creatinine, Ser 0.98; Hemoglobin 13.5; Platelets 182; Potassium 3.9; Sodium 138  Recent Lipid Panel    Component Value Date/Time   CHOL 221 (H) 01/13/2019 0227   TRIG 96 01/13/2019 0227   HDL 39 (L) 01/13/2019 0227   CHOLHDL 5.7 01/13/2019 0227   VLDL 19 01/13/2019 0227   LDLCALC NOT CALCULATED 01/13/2019 0227    Physical Exam:    VS:  BP 137/72   Pulse 95   Temp (!) 97.2 F (36.2 C)   Ht 5\' 7"  (1.702 m)   Wt 177 lb 12.8 oz (80.6 kg)   SpO2 98%   BMI 27.85 kg/m     Wt Readings from Last 3 Encounters:  02/05/19 177 lb 12.8 oz (80.6 kg)  01/12/19 172 lb 9.9 oz (78.3 kg)  05/05/16 180 lb (81.6 kg)     GEN:  Well nourished, well developed in no acute distress HEENT: Normal NECK: No JVD; No carotid bruits LYMPHATICS: No lymphadenopathy CARDIAC: RRR, widely split second heart sound, no murmurs, rubs, gallops RESPIRATORY:  Clear to auscultation without rales, wheezing or rhonchi  ABDOMEN: Soft, non-tender, non-distended MUSCULOSKELETAL:  No edema; No deformity  SKIN: Warm and dry NEUROLOGIC:  Alert and oriented x 3 PSYCHIATRIC:  Normal affect   ASSESSMENT:    1. Second degree AV block    PLAN:    In order of problems listed above:  1. Chest pain: Remains unexplained, but suspect noncardiac in etiology 2. Second-degree AV block: While he was in the hospital and lying in bed this was asymptomatic.  Plan a 30-day event monitor to look for high-grade AV block. 3. HTN: Adequately controlled without medications. 4. HLP: On atorvastatin.   Medication Adjustments/Labs and Tests Ordered: Current medicines are reviewed at length with the patient today.  Concerns regarding medicines are outlined above.  Orders Placed This Encounter  Procedures  . Cardiac event  monitor   No orders of the defined types were placed in this encounter.   Patient Instructions  Medication Instructions:  No changes *If you need a refill on your cardiac medications before your next appointment, please call your pharmacy*  Lab Work: None ordered If you have labs (blood work) drawn today and your tests are completely normal, you will receive your results only by: Marland Kitchen. MyChart Message (if you have MyChart) OR . A paper copy in the mail If you have any lab test that is abnormal or we need to change your treatment, we will call you to review the results.  Testing/Procedures: Your physician  has recommended that you wear a 30 day cardiac event monitor. Event monitors are medical devices that record the heart's electrical activity. Doctors most often [place] these monitors to diagnose arrhythmias. Arrhythmias are problems with the speed or rhythm of the heartbeat. The monitor is a small, portable device. You can wear one while you do your normal daily activities. This is usually used to diagnose what is causing palpitations/syncope (passing out).  This will be mailed to you. Someone will reach out to you soon with instructions.   Follow-Up: At [facility], you and your health needs are our priority.  As part of our continuing mission to provide you with exceptional heart care, we have created designated Provider Care Teams.  These Care Teams include your primary Cardiologist (physician) and Advanced Practice Providers (APPs -  Physician Assistants and Nurse Practitioners) who all work together to provide you with the care you need, when you need it.  Your next appointment:   6 months  The format for your next appointment:   In Person  Provider:   Dr. Sallyanne Kuster     Signed, Sanda Klein, MD  02/05/2019 2:20 PM    [place]

## 2019-02-05 NOTE — Patient Instructions (Addendum)
Medication Instructions:  No changes *If you need a refill on your cardiac medications before your next appointment, please call your pharmacy*  Lab Work: None ordered If you have labs (blood work) drawn today and your tests are completely normal, you will receive your results only by: Marland Kitchen MyChart Message (if you have MyChart) OR . A paper copy in the mail If you have any lab test that is abnormal or we need to change your treatment, we will call you to review the results.  Testing/Procedures: Your physician has recommended that you wear a 30 day cardiac event monitor. Event monitors are medical devices that record the heart's electrical activity. Doctors most often [place] these monitors to diagnose arrhythmias. Arrhythmias are problems with the speed or rhythm of the heartbeat. The monitor is a small, portable device. You can wear one while you do your normal daily activities. This is usually used to diagnose what is causing palpitations/syncope (passing out).  This will be mailed to you. Someone will reach out to you soon with instructions.   Follow-Up: At [facility], you and your health needs are our priority.  As part of our continuing mission to provide you with exceptional heart care, we have created designated Provider Care Teams.  These Care Teams include your primary Cardiologist (physician) and Advanced Practice Providers (APPs -  Physician Assistants and Nurse Practitioners) who all work together to provide you with the care you need, when you need it.  Your next appointment:   6 months  The format for your next appointment:   In Person  Provider:   Dr. Sallyanne Kuster

## 2019-02-11 ENCOUNTER — Telehealth: Payer: Self-pay | Admitting: *Deleted

## 2019-02-11 NOTE — Telephone Encounter (Signed)
[employer] to ship a 30 day cardiac event monitor to the patients home.  Instructions reviewed briefly as they are included in the monitor kit. 

## 2019-02-16 ENCOUNTER — Ambulatory Visit (INDEPENDENT_AMBULATORY_CARE_PROVIDER_SITE_OTHER): Payer: Medicare Other

## 2019-02-16 DIAGNOSIS — I441 Atrioventricular block, second degree: Secondary | ICD-10-CM | POA: Diagnosis not present

## 2019-02-19 ENCOUNTER — Telehealth: Payer: Self-pay

## 2019-02-19 ENCOUNTER — Other Ambulatory Visit: Payer: Self-pay

## 2019-02-19 MED ORDER — ATORVASTATIN CALCIUM 40 MG PO TABS: 40.0000 mg | ORAL_TABLET | Freq: Every day | ORAL | 2 refills | Status: DC

## 2019-02-19 NOTE — Telephone Encounter (Signed)
Please continue atorvastatin

## 2019-02-19 NOTE — Telephone Encounter (Signed)
Patients son called to request a refill on Atorvastatin. I see that it was sent in. He wanted to know if Dr. Sallyanne Kuster wanted him to continue or discontinue, please call back.

## 2019-02-19 NOTE — Telephone Encounter (Signed)
Will send to MD to verify if patient should continue to take Atorvastatin- I did not see any notes to stop it, but just wanted to clarify. Thank you!

## 2019-02-19 NOTE — Telephone Encounter (Signed)
Called patient son, advised to continue medication and that it was sent to pharmacy.  No other questions or concerns.

## 2019-03-05 ENCOUNTER — Ambulatory Visit: Payer: Medicare Other | Admitting: Medical

## 2019-05-31 ENCOUNTER — Other Ambulatory Visit: Payer: Self-pay

## 2019-05-31 MED ORDER — ATORVASTATIN CALCIUM 40 MG PO TABS: 40.0000 mg | ORAL_TABLET | Freq: Every day | ORAL | 2 refills | Status: DC

## 2019-08-03 ENCOUNTER — Ambulatory Visit (INDEPENDENT_AMBULATORY_CARE_PROVIDER_SITE_OTHER): Payer: Medicare Other | Admitting: Ophthalmology

## 2019-08-03 ENCOUNTER — Other Ambulatory Visit: Payer: Self-pay

## 2019-08-03 ENCOUNTER — Encounter (INDEPENDENT_AMBULATORY_CARE_PROVIDER_SITE_OTHER): Payer: Self-pay | Admitting: Ophthalmology

## 2019-08-03 DIAGNOSIS — H16001 Unspecified corneal ulcer, right eye: Secondary | ICD-10-CM

## 2019-08-03 DIAGNOSIS — H401132 Primary open-angle glaucoma, bilateral, moderate stage: Secondary | ICD-10-CM

## 2019-08-03 DIAGNOSIS — H353212 Exudative age-related macular degeneration, right eye, with inactive choroidal neovascularization: Secondary | ICD-10-CM

## 2019-08-03 DIAGNOSIS — H353222 Exudative age-related macular degeneration, left eye, with inactive choroidal neovascularization: Secondary | ICD-10-CM | POA: Diagnosis not present

## 2019-08-03 DIAGNOSIS — H353134 Nonexudative age-related macular degeneration, bilateral, advanced atrophic with subfoveal involvement: Secondary | ICD-10-CM | POA: Insufficient documentation

## 2019-08-03 DIAGNOSIS — H353133 Nonexudative age-related macular degeneration, bilateral, advanced atrophic without subfoveal involvement: Secondary | ICD-10-CM

## 2019-08-03 DIAGNOSIS — H04123 Dry eye syndrome of bilateral lacrimal glands: Secondary | ICD-10-CM

## 2019-08-03 NOTE — Progress Notes (Signed)
08/03/2019     CHIEF COMPLAINT Patient presents for Retina Follow Up   HISTORY OF PRESENT ILLNESS: Jacob Kirk is a 86 y.o. male who presents to the clinic today for:   HPI    Retina Follow Up    Patient presents with  Dry AMD.  In both eyes.  Duration of 8 months.  Since onset it is stable.          Comments    8 month follow up - OCT OU Patient denies change in vision and overall has no complaints.        Last edited by Gerda Diss on 08/03/2019  9:01 AM. (History)      Referring physician: Bernerd Limbo, MD [place],  [place] [address]  HISTORICAL INFORMATION:   Selected notes from the MEDICAL RECORD NUMBER       CURRENT MEDICATIONS: Current Outpatient Medications (Ophthalmic Drugs)  Medication Sig  . LUMIGAN 0.01 % SOLN Place 1 drop into both eyes daily.  Marland Kitchen XIIDRA 5 % SOLN Place 1 drop into both eyes 2 (two) times daily.  Marland Kitchen olopatadine (PATADAY) 0.1 % ophthalmic solution Place 1 drop into both eyes 2 (two) times daily.   No current facility-administered medications for this visit. (Ophthalmic Drugs)   Current Outpatient Medications (Other)  Medication Sig  . aspirin EC 81 MG EC tablet Take 1 tablet (81 mg total) by mouth daily.  Marland Kitchen atorvastatin (LIPITOR) 40 MG tablet Take 1 tablet (40 mg total) by mouth daily at 6 PM. PT NEEDS OV FOR FUTURE REFILL.  Marland Kitchen diphenhydrAMINE (SOMINEX) 25 MG tablet Take by mouth.  . nitroGLYCERIN (NITROSTAT) 0.4 MG SL tablet Place 1 tablet (0.4 mg total) under the tongue every 5 (five) minutes as needed for chest pain.  Marland Kitchen OVER THE COUNTER MEDICATION Take by mouth daily. CBD Oil  . diphenhydrAMINE (BENADRYL) 25 MG tablet Take 75 mg by mouth at bedtime.   No current facility-administered medications for this visit. (Other)      REVIEW OF SYSTEMS:    ALLERGIES No Known Allergies  PAST MEDICAL HISTORY Past Medical History:  Diagnosis Date  . Bronchitis   . Coronary artery disease   .  Spinal stenosis    Past Surgical History:  Procedure Laterality Date  . APPENDECTOMY    . JOINT REPLACEMENT      FAMILY HISTORY History reviewed. No pertinent family history.  SOCIAL HISTORY Social History   Tobacco Use  . Smoking status: Never Smoker  . Smokeless tobacco: Never Used  Substance Use Topics  . Alcohol use: No  . Drug use: No         OPHTHALMIC EXAM: Base Eye Exam    Visual Acuity (Snellen - Linear)      Right Left   Dist cc 20/400 CF @ 2'   Dist ph cc NI NI   Correction: Glasses       Tonometry (Tonopen, 9:11 AM)      Right Left   Pressure 13 11       Pupils      Dark Light Shape React APD   Right 6 6 Irregular Minimal None   Left 5 4 Round Brisk None       Visual Fields (Counting fingers)      Left Right    Full Full       Extraocular Movement      Right Left    Full Full  Neuro/Psych    Oriented x3: Yes   Mood/Affect: Normal       Dilation    Both eyes: 1.0% Mydriacyl, 2.5% Phenylephrine @ 9:11 AM        Slit Lamp and Fundus Exam    External Exam      Right Left   External Normal Normal       Slit Lamp Exam      Right Left   Lids/Lashes Normal Normal   Conjunctiva/Sclera White and quiet White and quiet   Cornea Clear Clear   Anterior Chamber Deep and quiet Deep and quiet   Iris Round and reactive Round and reactive   Lens Posterior chamber intraocular lens Posterior chamber intraocular lens   Vitreous Normal Normal          IMAGING AND PROCEDURES  Imaging and Procedures for 08/03/19           ASSESSMENT/PLAN:  No problem-specific Assessment & Plan notes found for this encounter.      ICD-10-CM   1. 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 Corneal erosion of right eye  H16.001   3. Exudative age-related macular degeneration of left eye with inactive choroidal neovascularization (HCC)  H35.3222   4. Exudative age-related  macular degeneration of right eye with inactive choroidal neovascularization (HCC)  H35.3212   5. Dry eyes  H04.123     1.  2.  3.  Ophthalmic Meds Ordered this visit:  No orders of the defined types were placed in this encounter.      No follow-ups on file.  There are no Patient Instructions on file for this visit.   Explained the diagnoses, plan, and follow up with the patient and they expressed understanding.  Patient expressed understanding of the importance of proper follow up care.   Alford Highland Farzana Koci M.D. Diseases & Surgery of the Retina and Vitreous [facility] 08/03/19     Abbreviations: M myopia (nearsighted); A astigmatism; H hyperopia (farsighted); P presbyopia; Mrx spectacle prescription;  CTL contact lenses; OD right eye; OS left eye; OU both eyes  XT exotropia; ET esotropia; PEK punctate epithelial keratitis; PEE punctate epithelial erosions; DES dry eye syndrome; MGD meibomian gland dysfunction; ATs artificial tears; PFAT's preservative free artificial tears; NSC nuclear sclerotic cataract; PSC posterior subcapsular cataract; ERM epi-retinal membrane; PVD posterior vitreous detachment; RD retinal detachment; DM diabetes mellitus; DR diabetic retinopathy; NPDR non-proliferative diabetic retinopathy; PDR proliferative diabetic retinopathy; CSME clinically significant macular edema; DME diabetic macular edema; dbh dot blot hemorrhages; CWS cotton wool spot; POAG primary open angle glaucoma; C/D cup-to-disc ratio; HVF humphrey visual field; GVF goldmann visual field; OCT optical coherence tomography; IOP intraocular pressure; BRVO Branch retinal vein occlusion; CRVO central retinal vein occlusion; CRAO central retinal artery occlusion; BRAO branch retinal artery occlusion; RT retinal tear; SB scleral buckle; PPV pars plana vitrectomy; VH Vitreous hemorrhage; PRP panretinal laser photocoagulation; IVK intravitreal kenalog; VMT vitreomacular traction; MH  Macular hole;  NVD neovascularization of the disc; NVE neovascularization elsewhere; AREDS age related eye disease study; ARMD age related macular degeneration; POAG primary open angle glaucoma; EBMD epithelial/anterior basement membrane dystrophy; ACIOL anterior chamber intraocular lens; IOL intraocular lens; PCIOL posterior chamber intraocular lens; Phaco/IOL phacoemulsification with intraocular lens placement; PRK photorefractive keratectomy; LASIK laser assisted in situ keratomileusis; HTN hypertension; DM diabetes mellitus; COPD chronic obstructive pulmonary disease

## 2019-08-26 ENCOUNTER — Other Ambulatory Visit: Payer: Self-pay

## 2019-08-26 MED ORDER — ATORVASTATIN CALCIUM 40 MG PO TABS: 40.0000 mg | ORAL_TABLET | Freq: Every day | ORAL | 0 refills | Status: DC

## 2019-09-15 ENCOUNTER — Other Ambulatory Visit: Payer: Self-pay | Admitting: Cardiovascular Disease

## 2019-09-15 MED ORDER — ATORVASTATIN CALCIUM 40 MG PO TABS: 40.0000 mg | ORAL_TABLET | Freq: Every day | ORAL | 0 refills | Status: DC

## 2019-09-15 NOTE — Telephone Encounter (Signed)
*  STAT* If patient is at the pharmacy, call can be transferred to refill team.   1. Which medications need to be refilled? (please list name of each medication and dose if known) atorvastatin (LIPITOR) 40 MG tablet  2. Which pharmacy/location (including street and city if local pharmacy) is medication to be sent to? [address] [place], [place] [address]   3. Do they need a 30 day or 90 day supply? 90   Patient has an appt with Dr. Royann Shivers on 09/21/19

## 2019-09-21 ENCOUNTER — Encounter: Payer: Self-pay | Admitting: Cardiovascular Disease

## 2019-09-21 ENCOUNTER — Telehealth: Payer: Self-pay | Admitting: Cardiovascular Disease

## 2019-09-21 ENCOUNTER — Ambulatory Visit: Payer: Medicare Other | Admitting: Cardiovascular Disease

## 2019-09-21 ENCOUNTER — Other Ambulatory Visit: Payer: Self-pay

## 2019-09-21 VITALS — BP 115/60 | HR 89 | Temp 97.0°F | Ht 68.0 in | Wt 168.4 lb

## 2019-09-21 DIAGNOSIS — I453 Trifascicular block: Secondary | ICD-10-CM

## 2019-09-21 DIAGNOSIS — R55 Syncope and collapse: Secondary | ICD-10-CM | POA: Diagnosis not present

## 2019-09-21 DIAGNOSIS — I700 Atherosclerosis of aorta: Secondary | ICD-10-CM

## 2019-09-21 DIAGNOSIS — I441 Atrioventricular block, second degree: Secondary | ICD-10-CM | POA: Diagnosis not present

## 2019-09-21 DIAGNOSIS — E7800 Pure hypercholesterolemia, unspecified: Secondary | ICD-10-CM

## 2019-09-21 NOTE — Telephone Encounter (Signed)
Patient's son, [name], is requesting to accompany the patient during appointment scheduled for today, 09/21/19 at 4:20 PM with Dr. Royann Shivers. [name] states he requires assistance due to patient being legally blind and having hearing impairment. Please advise.

## 2019-09-21 NOTE — Patient Instructions (Signed)
   [facility] at [facility]  [address], [address]  [place], [place] [address]  Phone: [phone] Fax: [phone]   You are scheduled for a Loop Recorder Implant on Thursday 09/30/19  at 9:40 AM with Dr. Royann Shivers. This will take place at [address] [facility], [address].    Please arrive at your appointment 15-20 minutes early.    You do not need to be fasting.    The procedure is performed with local anesthesia. You will not receive sedatives nor will an IV be placed.    Wash your chest and neck with the surgical soap the evening before and the morning of your procedure. Please following the washing instructions provided.    As with all surgical implants, there is a small risk of infection. If an infection occurs, the device will be removed. To help reduce the risk, please use the surgical scrub provided. Additional antiseptic precautions will be taken at the time of the procedure.    Please bring your insurance cards.   *Please note that scheduled loop recorder implants may need to be rescheduled if Dr. Royann Shivers has a procedure urgently added on at the hospital   Preparing for the Procedure  Before the procedure, you can play an important role. Because skin is not sterile, your skin needs to be as free of germs as possible. You can reduce the number of germs on your skin by washing with CHG (chlorhexidine gluconate) Soap before the procedure. CHG is an antiseptic cleaner which kills germs and bonds with the skin to continue killing germs even after washing.  Please do not use if you have an allergy to CHG or antibacterial soaps. If your skin becomes reddened/irritated, STOP using the CHG.  DO NOT SHAVE (including legs and underarms) for at least 48 hours prior to first CHG shower. It is OK to shave your face.  Please follow these instructions carefully: 1. Shower the night before the procedure and the morning of with CHG Soap. 2. If you chose to wash  your hair, wash your hair first as usual with your normal shampoo/conditioner. 3. After you shampoo/condition, rinse you hair and body thoroughly to remove shampoo/conditioner. 4. Use CHG as you would any other liquid soap. You can apply CHG directly to the skin and wash gently with a loofah or a clean washcloth. 5. Apply the CHG Soap to your body ONLY FROM THE NECK DOWN. Do not use on open wounds or open sores. Avoid contact with your eyes, ears, mouth, and genitals (private parts).  6. Wash thoroughly, paying special attention to the area where your surgery will be performed. 7. Thoroughly rinse your body with warm water from the neck down. 8. DO NOT shower/wash with your normal soap after using and rinsing off the CHG Soap. 9. Pat yourself dry with a clean towel. 10. Wear clean pajamas to bed. 11. Place clean sheets on your bed the night of your first shower and do not sleep with pets..  Day of Surgery: Shower with the CHG Soap following the instructions listed above. DO NOT apply deodorants or lotions.

## 2019-09-21 NOTE — Progress Notes (Signed)
Cardiology Office Note:    Date:  09/22/2019   ID:  Jacob Kirk, DOB 04-14-1925, MRN [id number]  PCP:  Tracey Harries, MD  Cardiologist:  No primary care provider on file.  Electrophysiologist:  None   Referring MD: Tracey Harries, MD   Chief Complaint  Patient presents with  . Loss of Consciousness    History of Present Illness:    Jacob Kirk is a 86 y.o. male with a recent episode of witnessed near syncope (he was walking with his son) on May 22.  He fell and had a gash in his forehead that required stitches.  He was persistently dizzy, but oriented after the event.  He has a history of second-degree AV block, Mobitz type I during a hospitalization for atypical chest/epigastric discomfort about 6 months ago,.  During that hospital stay he had a an echocardiogram with normal findings and a nuclear stress test with a questionable inferolateral area of mixed reversible/fixed defect (in my opinion there was no evidence of ischemic abnormalities).  No evidence of pulmonary embolism by CT angiography, but there was heavy atherosclerotic calcification seen in the coronary arteries, particularly in the proximal-mid LAD and throughout the AV groove segment of the left circumflex coronary artery.  Subsequent event monitoring did not show any evidence of second-degree AV block or any other major arrhythmia.  ECG today shows sinus rhythm with first-degree AV block, left axis deviation consistent with left anterior fascicular block and incomplete right bundle branch block.  The QTC is normal at 445 ms and there are no ischemic repolarization abnormalities.  The patient specifically denies any chest pain at rest exertion, dyspnea at rest or with exertion, orthopnea, paroxysmal nocturnal dyspnea, palpitations, focal neurological deficits, intermittent claudication, lower extremity edema, unexplained weight gain, cough, hemoptysis or wheezing.   Past Medical History:  Diagnosis Date  . Bronchitis    . Coronary artery disease   . Spinal stenosis     Past Surgical History:  Procedure Laterality Date  . APPENDECTOMY    . JOINT REPLACEMENT      Current Medications: Current Meds  Medication Sig  . aspirin EC 81 MG EC tablet Take 1 tablet (81 mg total) by mouth daily.  Marland Kitchen atorvastatin (LIPITOR) 40 MG tablet Take 1 tablet (40 mg total) by mouth daily at 6 PM. Keep scheduled ov for refill  . diphenhydrAMINE (BENADRYL) 25 MG tablet Take 75 mg by mouth at bedtime.  . diphenhydrAMINE (SOMINEX) 25 MG tablet Take by mouth.  . LUMIGAN 0.01 % SOLN Place 1 drop into both eyes daily.  . nitroGLYCERIN (NITROSTAT) 0.4 MG SL tablet Place 1 tablet (0.4 mg total) under the tongue every 5 (five) minutes as needed for chest pain.  Marland Kitchen OVER THE COUNTER MEDICATION Take by mouth daily. CBD Oil  . XIIDRA 5 % SOLN Place 1 drop into both eyes 2 (two) times daily.     Allergies:   Patient has no known allergies.   Social History   Socioeconomic History  . Marital status: Married    Spouse name: Not on file  . Number of children: Not on file  . Years of education: Not on file  . Highest education level: Not on file  Occupational History  . Not on file  Tobacco Use  . Smoking status: Never Smoker  . Smokeless tobacco: Never Used  Substance and Sexual Activity  . Alcohol use: No  . Drug use: No  . Sexual activity: Not on file  Other Topics Concern  .  Not on file  Social History Narrative  . Not on file   Social Determinants of Health   Financial Resource Strain:   . Difficulty of Paying Living Expenses:   Food Insecurity:   . Worried About Programme researcher, broadcasting/film/video in the Last Year:   . Barista in the Last Year:   Transportation Needs:   . Freight forwarder (Medical):   Marland Kitchen Lack of Transportation (Non-Medical):   Physical Activity:   . Days of Exercise per Week:   . Minutes of Exercise per Session:   Stress:   . Feeling of Stress :   Social Connections:   . Frequency of  Communication with Friends and Family:   . Frequency of Social Gatherings with Friends and Family:   . Attends Religious Services:   . Active Member of Clubs or Organizations:   . Attends Banker Meetings:   Marland Kitchen Marital Status:      Family History: The patient's family history is significantly negative for premature Cardiac/vascular disease or arrhythmia  ROS:   Please see the history of present illness.     All other systems reviewed and are negative.  EKGs/Labs/Other Studies Reviewed:    The following studies were reviewed today: ER visit notes and imaging studies from May 22.  EKG: ECG today shows sinus rhythm with trifascicular block (first-degree AV block, left anterior fascicular block, almost meets full criteria for right bundle branch block).  No ischemic changes, QTC 445 ms.  Recent Labs: 01/12/2019: BUN 29; Creatinine, Ser 0.98; Hemoglobin 13.5; Platelets 182; Potassium 3.9; Sodium 138  Recent Lipid Panel    Component Value Date/Time   CHOL 221 (H) 01/13/2019 0227   TRIG 96 01/13/2019 0227   HDL 39 (L) 01/13/2019 0227   CHOLHDL 5.7 01/13/2019 0227   VLDL 19 01/13/2019 0227   LDLCALC NOT CALCULATED 01/13/2019 0227    Physical Exam:    VS:  BP 115/60   Pulse 89   Temp (!) 97 F (36.1 C)   Ht 5\' 8"  (1.727 m)   Wt 168 lb 6.4 oz (76.4 kg)   SpO2 99%   BMI 25.61 kg/m     Wt Readings from Last 3 Encounters:  09/21/19 168 lb 6.4 oz (76.4 kg)  02/05/19 177 lb 12.8 oz (80.6 kg)  01/12/19 172 lb 9.9 oz (78.3 kg)      General: Alert, oriented x3, no distress, laceration mid forehead is healing well.  He is lean and appears fit and younger than his stated age. Head: no evidence of trauma, PERRL, EOMI, no exophtalmos or lid lag, no myxedema, no xanthelasma; normal ears, nose and oropharynx Neck: normal jugular venous pulsations and no hepatojugular reflux; brisk carotid pulses without delay and no carotid bruits Chest: clear to auscultation, no signs of  consolidation by percussion or palpation, normal fremitus, symmetrical and full respiratory excursions Cardiovascular: normal position and quality of the apical impulse, regular rhythm, normal first and widely split second heart sounds, no murmurs, rubs or gallops Abdomen: no tenderness or distention, no masses by palpation, no abnormal pulsatility or arterial bruits, normal bowel sounds, no hepatosplenomegaly Extremities: no clubbing, cyanosis or edema; 2+ radial, ulnar and brachial pulses bilaterally; 2+ right femoral, posterior tibial and dorsalis pedis pulses; 2+ left femoral, posterior tibial and dorsalis pedis pulses; no subclavian or femoral bruits Neurological: grossly nonfocal Psych: Normal mood and affect   ASSESSMENT:    1. Second degree AV block   2. Trifascicular block  3. Syncope and collapse   4. Hypercholesterolemia   5. Aortic atherosclerosis (HCC)    PLAN:    In order of problems listed above:  1. Syncope: I am concerned that this was related to episode of high-grade second-degree AV block or transient complete heart block.  He has trifascicular block on his baseline ECG and had documented Mobitz type I second-degree AV block last December, although this was not subsequently seen on 30-day event monitoring.  It is possible that he needs a pacemaker.  Recommended an implantable loop recorder for from diagnosis. This procedure has been fully reviewed with the patient son (who has legal and medical power of attorney) and informed consent has been obtained. 2. HTN: Adequately controlled without medications.  Currently without symptoms to suggest orthostatic hypotension. 3. HLP: On atorvastatin.  he may have coronary disease, but had a low risk nuclear stress test.  Extensive aortic atherosclerosis noted on imaging studies.  Target LDL less than 100.  Needs follow-up labs.   Medication Adjustments/Labs and Tests Ordered: Current medicines are reviewed at length with the  patient today.  Concerns regarding medicines are outlined above.  Orders Placed This Encounter  Procedures  . EKG 12-Lead   No orders of the defined types were placed in this encounter.   Patient Instructions    [facility] at [facility]  [address]  [place], [place] [address]  Phone: [phone] Fax: [phone]   You are scheduled for a Loop Recorder Implant on Thursday 09/30/19  at 9:40 AM with Dr. Royann Shivers. This will take place at [address] [facility], [address].    Please arrive at your appointment 15-20 minutes early.    You do not need to be fasting.    The procedure is performed with local anesthesia. You will not receive sedatives nor will an IV be placed.    Wash your chest and neck with the surgical soap the evening before and the morning of your procedure. Please following the washing instructions provided.    As with all surgical implants, there is a small risk of infection. If an infection occurs, the device will be removed. To help reduce the risk, please use the surgical scrub provided. Additional antiseptic precautions will be taken at the time of the procedure.    Please bring your insurance cards.   *Please note that scheduled loop recorder implants may need to be rescheduled if Dr. Royann Shivers has a procedure urgently added on at the hospital   Preparing for the Procedure  Before the procedure, you can play an important role. Because skin is not sterile, your skin needs to be as free of germs as possible. You can reduce the number of germs on your skin by washing with CHG (chlorhexidine gluconate) Soap before the procedure. CHG is an antiseptic cleaner which kills germs and bonds with the skin to continue killing germs even after washing.  Please do not use if you have an allergy to CHG or antibacterial soaps. If your skin becomes reddened/irritated, STOP using the CHG.  DO NOT SHAVE (including legs and underarms) for at  least 48 hours prior to first CHG shower. It is OK to shave your face.  Please follow these instructions carefully: 1. Shower the night before the procedure and the morning of with CHG Soap. 2. If you chose to wash your hair, wash your hair first as usual with your normal shampoo/conditioner. 3. After you shampoo/condition, rinse you hair and body thoroughly to  remove shampoo/conditioner. 4. Use CHG as you would any other liquid soap. You can apply CHG directly to the skin and wash gently with a loofah or a clean washcloth. 5. Apply the CHG Soap to your body ONLY FROM THE NECK DOWN. Do not use on open wounds or open sores. Avoid contact with your eyes, ears, mouth, and genitals (private parts).  6. Wash thoroughly, paying special attention to the area where your surgery will be performed. 7. Thoroughly rinse your body with warm water from the neck down. 8. DO NOT shower/wash with your normal soap after using and rinsing off the CHG Soap. 9. Pat yourself dry with a clean towel. 10. Wear clean pajamas to bed. 11. Place clean sheets on your bed the night of your first shower and do not sleep with pets..  Day of Surgery: Shower with the CHG Soap following the instructions listed above. DO NOT apply deodorants or lotions.       Signed, Sanda Klein, MD  09/22/2019 8:37 AM    [facility]

## 2019-09-21 NOTE — Telephone Encounter (Signed)
Pt's son aware okay to come to appt with pt ./cy

## 2019-09-22 ENCOUNTER — Encounter: Payer: Self-pay | Admitting: Cardiovascular Disease

## 2019-09-22 DIAGNOSIS — I453 Trifascicular block: Secondary | ICD-10-CM | POA: Insufficient documentation

## 2019-09-22 DIAGNOSIS — I441 Atrioventricular block, second degree: Secondary | ICD-10-CM | POA: Insufficient documentation

## 2019-09-22 DIAGNOSIS — I700 Atherosclerosis of aorta: Secondary | ICD-10-CM | POA: Insufficient documentation

## 2019-09-23 MED ORDER — ATORVASTATIN CALCIUM 40 MG PO TABS: 40.0000 mg | ORAL_TABLET | Freq: Every day | ORAL | 3 refills | Status: DC

## 2019-09-30 ENCOUNTER — Ambulatory Visit: Payer: Medicare Other | Admitting: Cardiovascular Disease

## 2019-09-30 ENCOUNTER — Other Ambulatory Visit: Payer: Self-pay

## 2019-09-30 DIAGNOSIS — R55 Syncope and collapse: Secondary | ICD-10-CM | POA: Diagnosis not present

## 2019-09-30 DIAGNOSIS — Z95818 Presence of other cardiac implants and grafts: Secondary | ICD-10-CM

## 2019-09-30 MED ORDER — LIDOCAINE-EPINEPHRINE 1 %-1:100000 IJ SOLN: 10.0000 mL | Freq: Once | INTRAMUSCULAR | Status: AC

## 2019-09-30 NOTE — Progress Notes (Signed)
LOOP RECORDER IMPLANT   Procedure report  Procedure performed:  Loop recorder implantation   Reason for procedure:  1. Recurrent syncope/near-syncope 2. Cryptogenic stroke Procedure performed by:  Thurmon Fair, MD  Complications:  None  Estimated blood loss:  <5 mL  Medications administered during procedure:  Lidocaine 1% with 1/100,000 epinephrine 10 mL locally Device details:  [employer]onic Reveal Linq model number [id number], serial number [id number] Procedure details:  After the risks and benefits of the procedure were discussed the patient provided informed consent. The patient was prepped and draped in usual sterile fashion. Local anesthesia was administered to an area 2 cm to the left of the sternum in the 4th intercostal space. A cutaneous incision was made using the incision tool. The introducer was then used to create a subcutaneous tunnel and carefully deploy the device. Local pressure was held to ensure hemostasis.  The incision was closed with SteriStrips and a sterile dressing was applied.  R waves 0.23 mV.  Thurmon Fair, MD, [facility] [phone] office [phone] pager 09/30/2019 10:11 AM

## 2019-09-30 NOTE — Patient Instructions (Signed)
Discharge Instructions for  Loop Recorder Implant    Follow up: Keep your wound check appointment on 10/07/19 at 11 am.  If you have any questions or concerns, please call the office at [phone].  ACTIVITY No restrictions. DO wear your seatbelt, even if it crosses over the site.   WOUND CARE  Keep the wound area clean and dry.  Remove the dressing the day after (usually 24 hours after the procedure).  DO NOT SUBMERGE UNDER WATER UNTIL FULLY HEALED (no tub baths, hot tubs, swimming pools, etc.).   You  may shower or take a sponge bath after the dressing is removed. DO NOT SOAK the area and do not allow the shower to directly spray on the site.  If you have tape/steri-strips on your wound, these will fall off; do not pull them off prematurely.    No bandage is needed on the site.  DO  NOT apply any creams, oils, or ointments to the wound area.  If you notice any drainage or discharge from the wound, any swelling, excessive redness or bruising at the site, or if you develop a fever > 101? F, call the office at once at [phone].

## 2019-10-01 ENCOUNTER — Telehealth: Payer: Self-pay | Admitting: Emergency Medicine

## 2019-10-01 NOTE — Telephone Encounter (Signed)
Spoke with patient and given permission to speak to his son Jacob Kirk I reference to [employer] concerning symptom activator use and LINQ. Education done on symptom activator use and informed that a call does not need to be made to [employer] after the use of the symptom activator. Will contact device clinic if he has a episode of dizziness or syncope that he uses symptom activator. Understands that remote monitor will send information to clinic for review. Device clinic # provided and office hours for any questions or concerns. Address confirmed and DPR to be mailed to patient to complete.

## 2019-10-07 ENCOUNTER — Telehealth (INDEPENDENT_AMBULATORY_CARE_PROVIDER_SITE_OTHER): Payer: Medicare Other | Admitting: Emergency Medicine

## 2019-10-07 ENCOUNTER — Other Ambulatory Visit: Payer: Self-pay

## 2019-10-07 DIAGNOSIS — R55 Syncope and collapse: Secondary | ICD-10-CM

## 2019-10-07 NOTE — Progress Notes (Signed)
Video visit with patient. Steri-strips removed by family. Wound site with approximated edges, no redness, drainage or edema at wound site. Verified that transmissions being received from remote monitor, no alerts, tachy,brady or pause episodes noted since implant. Education done on remote monitoring and s/sx of infection at wound site.

## 2019-11-02 ENCOUNTER — Ambulatory Visit (INDEPENDENT_AMBULATORY_CARE_PROVIDER_SITE_OTHER): Payer: Medicare Other | Admitting: *Deleted

## 2019-11-02 DIAGNOSIS — R55 Syncope and collapse: Secondary | ICD-10-CM

## 2019-11-03 LAB — CUP PACEART REMOTE DEVICE CHECK
Date Time Interrogation Session: 20210720091632
Implantable Pulse Generator Implant Date: 20210617

## 2019-11-03 NOTE — Progress Notes (Signed)
Carelink Summary Report / Loop Recorder 

## 2019-11-28 ENCOUNTER — Encounter (HOSPITAL_BASED_OUTPATIENT_CLINIC_OR_DEPARTMENT_OTHER): Payer: Self-pay

## 2019-11-28 ENCOUNTER — Emergency Department (HOSPITAL_BASED_OUTPATIENT_CLINIC_OR_DEPARTMENT_OTHER): Payer: Medicare Other

## 2019-11-28 ENCOUNTER — Other Ambulatory Visit: Payer: Self-pay

## 2019-11-28 ENCOUNTER — Emergency Department (HOSPITAL_BASED_OUTPATIENT_CLINIC_OR_DEPARTMENT_OTHER)
Admission: EM | Admit: 2019-11-28 | Discharge: 2019-11-28 | Disposition: A | Discharge status: Home / Self Care | Payer: Medicare Other | Attending: Emergency Medicine | Admitting: Emergency Medicine

## 2019-11-28 DIAGNOSIS — Y999 Unspecified external cause status: Secondary | ICD-10-CM | POA: Diagnosis not present

## 2019-11-28 DIAGNOSIS — Y939 Activity, unspecified: Secondary | ICD-10-CM | POA: Diagnosis not present

## 2019-11-28 DIAGNOSIS — I2510 Atherosclerotic heart disease of native coronary artery without angina pectoris: Secondary | ICD-10-CM | POA: Diagnosis not present

## 2019-11-28 DIAGNOSIS — R000 Tachycardia, unspecified: Secondary | ICD-10-CM | POA: Diagnosis not present

## 2019-11-28 DIAGNOSIS — R600 Localized edema: Secondary | ICD-10-CM | POA: Insufficient documentation

## 2019-11-28 DIAGNOSIS — Z7982 Long term (current) use of aspirin: Secondary | ICD-10-CM | POA: Insufficient documentation

## 2019-11-28 DIAGNOSIS — Y929 Unspecified place or not applicable: Secondary | ICD-10-CM | POA: Insufficient documentation

## 2019-11-28 DIAGNOSIS — W19XXXA Unspecified fall, initial encounter: Secondary | ICD-10-CM

## 2019-11-28 DIAGNOSIS — W1839XA Other fall on same level, initial encounter: Secondary | ICD-10-CM | POA: Insufficient documentation

## 2019-11-28 DIAGNOSIS — R609 Edema, unspecified: Secondary | ICD-10-CM

## 2019-11-28 DIAGNOSIS — M546 Pain in thoracic spine: Secondary | ICD-10-CM | POA: Diagnosis not present

## 2019-11-28 DIAGNOSIS — R4182 Altered mental status, unspecified: Secondary | ICD-10-CM | POA: Insufficient documentation

## 2019-11-28 DIAGNOSIS — Z79899 Other long term (current) drug therapy: Secondary | ICD-10-CM | POA: Insufficient documentation

## 2019-11-28 LAB — URINALYSIS, ROUTINE W REFLEX MICROSCOPIC
Bilirubin Urine: NEGATIVE
Glucose, UA: NEGATIVE mg/dL
Hgb urine dipstick: NEGATIVE
Ketones, ur: NEGATIVE mg/dL
Leukocytes,Ua: NEGATIVE
Nitrite: NEGATIVE
Protein, ur: NEGATIVE mg/dL
Specific Gravity, Urine: 1.015 (ref 1.005–1.030)
pH: 6.5 (ref 5.0–8.0)

## 2019-11-28 LAB — CBC WITH DIFFERENTIAL/PLATELET
Abs Immature Granulocytes: 0.02 10*3/uL (ref 0.00–0.07)
Basophils Absolute: 0 10*3/uL (ref 0.0–0.1)
Basophils Relative: 0 %
Eosinophils Absolute: 0.1 10*3/uL (ref 0.0–0.5)
Eosinophils Relative: 1 %
HCT: 38.4 % — ABNORMAL LOW (ref 39.0–52.0)
Hemoglobin: 12.4 g/dL — ABNORMAL LOW (ref 13.0–17.0)
Immature Granulocytes: 0 %
Lymphocytes Relative: 13 %
Lymphs Abs: 0.7 10*3/uL (ref 0.7–4.0)
MCH: 31.7 pg (ref 26.0–34.0)
MCHC: 32.3 g/dL (ref 30.0–36.0)
MCV: 98.2 fL (ref 80.0–100.0)
Monocytes Absolute: 0.5 10*3/uL (ref 0.1–1.0)
Monocytes Relative: 9 %
Neutro Abs: 4.3 10*3/uL (ref 1.7–7.7)
Neutrophils Relative %: 77 %
Platelets: 194 10*3/uL (ref 150–400)
RBC: 3.91 MIL/uL — ABNORMAL LOW (ref 4.22–5.81)
RDW: 13.8 % (ref 11.5–15.5)
WBC: 5.7 10*3/uL (ref 4.0–10.5)
nRBC: 0 % (ref 0.0–0.2)

## 2019-11-28 LAB — COMPREHENSIVE METABOLIC PANEL
ALT: 25 U/L (ref 0–44)
AST: 38 U/L (ref 15–41)
Albumin: 3.5 g/dL (ref 3.5–5.0)
Alkaline Phosphatase: 136 U/L — ABNORMAL HIGH (ref 38–126)
Anion gap: 10 (ref 5–15)
BUN: 34 mg/dL — ABNORMAL HIGH (ref 8–23)
CO2: 23 mmol/L (ref 22–32)
Calcium: 8.8 mg/dL — ABNORMAL LOW (ref 8.9–10.3)
Chloride: 105 mmol/L (ref 98–111)
Creatinine, Ser: 1.1 mg/dL (ref 0.61–1.24)
GFR calc Af Amer: >60 mL/min (ref >60)
GFR calc non Af Amer: 58 mL/min — ABNORMAL LOW (ref >60)
Glucose, Bld: 92 mg/dL (ref 70–99)
Potassium: 4 mmol/L (ref 3.5–5.1)
Sodium: 138 mmol/L (ref 135–145)
Total Bilirubin: 0.4 mg/dL (ref 0.3–1.2)
Total Protein: 6.2 g/dL — ABNORMAL LOW (ref 6.5–8.1)

## 2019-11-28 LAB — VALPROIC ACID LEVEL: Valproic Acid Lvl: 11 ug/mL — ABNORMAL LOW (ref 50.0–100.0)

## 2019-11-28 LAB — BRAIN NATRIURETIC PEPTIDE: B Natriuretic Peptide: 139.7 pg/mL — ABNORMAL HIGH (ref 0.0–100.0)

## 2019-11-28 LAB — TROPONIN I (HIGH SENSITIVITY): Troponin I (High Sensitivity): 17 ng/L (ref <18)

## 2019-11-28 NOTE — ED Triage Notes (Signed)
Pt comes from home via EMS s/p fall. Per family found outside on porch on his back , possibly there for an hour, patient states was locked outside after going out to water plants.  C/o lower back pain, no visible injury to area.  States was unable to turn over once he was on his back.  c-collar applied per ems protocol, denies neck pain.  Pt denies hitting head, no blood thinners.

## 2019-11-28 NOTE — ED Notes (Signed)
ED Provider at bedside. 
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
## 2019-12-06 ENCOUNTER — Ambulatory Visit (INDEPENDENT_AMBULATORY_CARE_PROVIDER_SITE_OTHER): Payer: Medicare Other | Admitting: *Deleted

## 2019-12-06 DIAGNOSIS — I441 Atrioventricular block, second degree: Secondary | ICD-10-CM | POA: Diagnosis not present

## 2019-12-06 LAB — CUP PACEART REMOTE DEVICE CHECK
Date Time Interrogation Session: 20210822092117
Implantable Pulse Generator Implant Date: 20210617

## 2019-12-13 NOTE — Progress Notes (Signed)
Carelink Summary Report / Loop Recorder 

## 2020-01-09 LAB — CUP PACEART REMOTE DEVICE CHECK
Date Time Interrogation Session: 20210924092633
Implantable Pulse Generator Implant Date: 20210617

## 2020-01-10 ENCOUNTER — Ambulatory Visit (INDEPENDENT_AMBULATORY_CARE_PROVIDER_SITE_OTHER): Payer: Medicare Other | Admitting: Emergency Medicine

## 2020-01-10 DIAGNOSIS — R55 Syncope and collapse: Secondary | ICD-10-CM | POA: Diagnosis not present

## 2020-01-13 NOTE — Progress Notes (Signed)
Carelink Summary Report / Loop Recorder 

## 2020-02-14 ENCOUNTER — Ambulatory Visit (INDEPENDENT_AMBULATORY_CARE_PROVIDER_SITE_OTHER): Payer: Medicare Other

## 2020-02-14 DIAGNOSIS — R55 Syncope and collapse: Secondary | ICD-10-CM | POA: Diagnosis not present

## 2020-02-14 LAB — CUP PACEART REMOTE DEVICE CHECK
Date Time Interrogation Session: 20211027092759
Implantable Pulse Generator Implant Date: 20210617

## 2020-02-17 NOTE — Progress Notes (Signed)
Carelink Summary Report / Loop Recorder 

## 2020-03-07 ENCOUNTER — Ambulatory Visit (INDEPENDENT_AMBULATORY_CARE_PROVIDER_SITE_OTHER): Payer: Medicare Other | Admitting: Ophthalmology

## 2020-03-07 ENCOUNTER — Encounter (INDEPENDENT_AMBULATORY_CARE_PROVIDER_SITE_OTHER): Payer: Self-pay | Admitting: Ophthalmology

## 2020-03-07 ENCOUNTER — Other Ambulatory Visit: Payer: Self-pay

## 2020-03-07 DIAGNOSIS — H353222 Exudative age-related macular degeneration, left eye, with inactive choroidal neovascularization: Secondary | ICD-10-CM | POA: Diagnosis not present

## 2020-03-07 DIAGNOSIS — H16001 Unspecified corneal ulcer, right eye: Secondary | ICD-10-CM

## 2020-03-07 DIAGNOSIS — H353134 Nonexudative age-related macular degeneration, bilateral, advanced atrophic with subfoveal involvement: Secondary | ICD-10-CM

## 2020-03-07 DIAGNOSIS — H353133 Nonexudative age-related macular degeneration, bilateral, advanced atrophic without subfoveal involvement: Secondary | ICD-10-CM

## 2020-03-07 NOTE — Assessment & Plan Note (Signed)

## 2020-03-07 NOTE — Progress Notes (Signed)
03/07/2020     CHIEF COMPLAINT Patient presents for Retina Follow Up   HISTORY OF PRESENT ILLNESS: Jacob Kirk is a 86 y.o. male who presents to the clinic today for:   HPI    Retina Follow Up    Patient presents with  Dry AMD.  In both eyes.  This started 7 months ago.  Severity is mild.  Duration of 7 months.  Since onset it is gradually worsening.          Comments    7 Month AMD F/U OU  Pt's son sts pt seems to have some vision loss since last visit. Pt does not state any changes.       Last edited by Ileana Roup, COA on 03/07/2020  9:10 AM. (History)      Referring physician: Tracey Harries, MD [address] [place],  [place] [address]  HISTORICAL INFORMATION:   Selected notes from the MEDICAL RECORD NUMBER       CURRENT MEDICATIONS: Current Outpatient Medications (Ophthalmic Drugs)  Medication Sig  . LUMIGAN 0.01 % SOLN Place 1 drop into both eyes daily.  Marland Kitchen XIIDRA 5 % SOLN Place 1 drop into both eyes 2 (two) times daily.   No current facility-administered medications for this visit. (Ophthalmic Drugs)   Current Outpatient Medications (Other)  Medication Sig  . aspirin EC 81 MG EC tablet Take 1 tablet (81 mg total) by mouth daily.  Marland Kitchen atorvastatin (LIPITOR) 40 MG tablet Take 1 tablet (40 mg total) by mouth daily at 6 PM.  . diphenhydrAMINE (BENADRYL) 25 MG tablet Take 75 mg by mouth at bedtime.  . diphenhydrAMINE (SOMINEX) 25 MG tablet Take by mouth.  . divalproex (DEPAKOTE) 125 MG DR tablet Take by mouth.  . furosemide (LASIX) 20 MG tablet Take by mouth.  . nitroGLYCERIN (NITROSTAT) 0.4 MG SL tablet Place 1 tablet (0.4 mg total) under the tongue every 5 (five) minutes as needed for chest pain.  Marland Kitchen OVER THE COUNTER MEDICATION Take by mouth daily. CBD Oil   Current Facility-Administered Medications (Other)  Medication Route  . lidocaine-EPINEPHrine (XYLOCAINE W/EPI) 1 %-1:100000 (with pres) injection 10 mL Infiltration       REVIEW OF SYSTEMS:    ALLERGIES No Known Allergies  PAST MEDICAL HISTORY Past Medical History:  Diagnosis Date  . Bronchitis   . Coronary artery disease   . Spinal stenosis    Past Surgical History:  Procedure Laterality Date  . APPENDECTOMY    . JOINT REPLACEMENT      FAMILY HISTORY History reviewed. No pertinent family history.  SOCIAL HISTORY Social History   Tobacco Use  . Smoking status: Never Smoker  . Smokeless tobacco: Never Used  Substance Use Topics  . Alcohol use: No  . Drug use: No         OPHTHALMIC EXAM:  Base Eye Exam    Visual Acuity (ETDRS)      Right Left   Dist cc CF @ 6' CF @ 2'   Dist ph cc NI NI   Correction: Glasses       Tonometry (Tonopen, 9:11 AM)      Right Left   Pressure 10 10       Pupils      Dark Light Shape React APD   Right 6 6 Irregular Minimal None   Left 5 5 Round Minimal None       Visual Fields (Counting fingers)      Left Right  Full Full       Extraocular Movement      Right Left    Full Full       Neuro/Psych    Oriented x3: Yes   Mood/Affect: Normal       Dilation    Both eyes: 1.0% Mydriacyl, 2.5% Phenylephrine @ 9:15 AM        Slit Lamp and Fundus Exam    External Exam      Right Left   External Normal Normal       Slit Lamp Exam      Right Left   Lids/Lashes Normal Normal   Conjunctiva/Sclera White and quiet White and quiet   Cornea Clear Clear   Anterior Chamber Deep and quiet Deep and quiet   Iris Round and reactive Round and reactive   Lens Posterior chamber intraocular lens Posterior chamber intraocular lens   Anterior Vitreous Normal Normal       Fundus Exam      Right Left   Posterior Vitreous Posterior vitreous detachment Posterior vitreous detachment   Disc Peripapillary atrophy Peripapillary atrophy   C/D Ratio 0.6 0.5   Macula Disciform scar, Atrophy, Geographic atrophy Disciform scar, Atrophy, Geographic atrophy   Vessels Normal Normal   Periphery  Normal Normal          IMAGING AND PROCEDURES  Imaging and Procedures for 03/07/20  OCT, Retina - OU - Both Eyes       Right Eye Quality was borderline. Scan locations included subfoveal. Findings include abnormal foveal contour, choroidal neovascular membrane, subretinal scarring.   Left Eye Quality was good. Scan locations included subfoveal. Findings include abnormal foveal contour, choroidal neovascular membrane, subretinal scarring.   Notes No active lesion edges present in either eye.                ASSESSMENT/PLAN:  Exudative age-related macular degeneration of left eye with inactive choroidal neovascularization (HCC) The nature of dry age related macular degeneration was discussed with the patient as well as its possible conversion to wet. The results of the AREDS 2 study was discussed with the patient. A diet rich in dark leafy green vegetables was advised and specific recommendations were made regarding supplements with AREDS 2 formulation . Control of hypertension and serum cholesterol may slow the disease. Smoking cessation is mandatory to slow the disease and diminish the risk of progressing to wet age related macular degeneration. The patient was instructed in the use of an Amsler Grid and was told to return immediately for any changes in the Grid. Stressed to the patient do not rub eyes  Advanced nonexudative age-related macular degeneration of both eyes with subfoveal involvement The nature of dry age related macular degeneration was discussed with the patient as well as its possible conversion to wet. The results of the AREDS 2 study was discussed with the patient. A diet rich in dark leafy green vegetables was advised and specific recommendations were made regarding supplements with AREDS 2 formulation . Control of hypertension and serum cholesterol may slow the disease. Smoking cessation is mandatory to slow the disease and diminish the risk of progressing to  wet age related macular degeneration. The patient was instructed in the use of an Amsler Grid and was told to return immediately for any changes in the Grid. Stressed to the patient do not rub eyes  Corneal erosion of right eye Follow up with Dr. Emily Filbert      ICD-10-CM   1. Advanced nonexudative age-related  macular degeneration of both eyes without subfoveal involvement  H35.3133 OCT, Retina - OU - Both Eyes  2. Exudative age-related macular degeneration of left eye with inactive choroidal neovascularization (HCC)  H35.3222   3. 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 Corneal erosion of right eye  H16.001     1.  2.  3.  Ophthalmic Meds Ordered this visit:  No orders of the defined types were placed in this encounter.      Return in about 6 months (around 09/04/2020) for DILATE OU, OCT.  There are no Patient Instructions on file for this visit.   Explained the diagnoses, plan, and follow up with the patient and they expressed understanding.  Patient expressed understanding of the importance of proper follow up care.   Alford Highland Erynn Vaca M.D. Diseases & Surgery of the Retina and Vitreous [facility] 03/07/20     Abbreviations: M myopia (nearsighted); A astigmatism; H hyperopia (farsighted); P presbyopia; Mrx spectacle prescription;  CTL contact lenses; OD right eye; OS left eye; OU both eyes  XT exotropia; ET esotropia; PEK punctate epithelial keratitis; PEE punctate epithelial erosions; DES dry eye syndrome; MGD meibomian gland dysfunction; ATs artificial tears; PFAT's preservative free artificial tears; NSC nuclear sclerotic cataract; PSC posterior subcapsular cataract; ERM epi-retinal membrane; PVD posterior vitreous detachment; RD retinal detachment; DM diabetes mellitus; DR diabetic retinopathy; NPDR non-proliferative diabetic retinopathy; PDR proliferative diabetic retinopathy; CSME  clinically significant macular edema; DME diabetic macular edema; dbh dot blot hemorrhages; CWS cotton wool spot; POAG primary open angle glaucoma; C/D cup-to-disc ratio; HVF humphrey visual field; GVF goldmann visual field; OCT optical coherence tomography; IOP intraocular pressure; BRVO Branch retinal vein occlusion; CRVO central retinal vein occlusion; CRAO central retinal artery occlusion; BRAO branch retinal artery occlusion; RT retinal tear; SB scleral buckle; PPV pars plana vitrectomy; VH Vitreous hemorrhage; PRP panretinal laser photocoagulation; IVK intravitreal kenalog; VMT vitreomacular traction; MH Macular hole;  NVD neovascularization of the disc; NVE neovascularization elsewhere; AREDS age related eye disease study; ARMD age related macular degeneration; POAG primary open angle glaucoma; EBMD epithelial/anterior basement membrane dystrophy; ACIOL anterior chamber intraocular lens; IOL intraocular lens; PCIOL posterior chamber intraocular lens; Phaco/IOL phacoemulsification with intraocular lens placement; PRK photorefractive keratectomy; LASIK laser assisted in situ keratomileusis; HTN hypertension; DM diabetes mellitus; COPD chronic obstructive pulmonary disease

## 2020-03-07 NOTE — Assessment & Plan Note (Signed)
Follow up with Dr. Emily Filbert

## 2020-03-18 LAB — CUP PACEART REMOTE DEVICE CHECK
Date Time Interrogation Session: 20211129083129
Implantable Pulse Generator Implant Date: 20210617

## 2020-03-20 ENCOUNTER — Ambulatory Visit (INDEPENDENT_AMBULATORY_CARE_PROVIDER_SITE_OTHER): Payer: Medicare Other

## 2020-03-20 DIAGNOSIS — R55 Syncope and collapse: Secondary | ICD-10-CM | POA: Diagnosis not present

## 2020-03-30 NOTE — Progress Notes (Signed)
Carelink Summary Report / Loop Recorder 

## 2020-04-17 ENCOUNTER — Ambulatory Visit (INDEPENDENT_AMBULATORY_CARE_PROVIDER_SITE_OTHER): Payer: Medicare Other

## 2020-04-17 DIAGNOSIS — I441 Atrioventricular block, second degree: Secondary | ICD-10-CM | POA: Diagnosis not present

## 2020-04-17 LAB — CUP PACEART REMOTE DEVICE CHECK
Date Time Interrogation Session: 20220101083452
Implantable Pulse Generator Implant Date: 20210617

## 2020-05-02 NOTE — Progress Notes (Signed)
Carelink Summary Report / Loop Recorder 

## 2020-05-18 ENCOUNTER — Ambulatory Visit (INDEPENDENT_AMBULATORY_CARE_PROVIDER_SITE_OTHER): Payer: Medicare Other

## 2020-05-18 DIAGNOSIS — I441 Atrioventricular block, second degree: Secondary | ICD-10-CM | POA: Diagnosis not present

## 2020-05-18 LAB — CUP PACEART REMOTE DEVICE CHECK
Date Time Interrogation Session: 20220203083608
Implantable Pulse Generator Implant Date: 20210617

## 2020-05-24 NOTE — Progress Notes (Signed)
Carelink Summary Report / Loop Recorder 

## 2020-06-19 ENCOUNTER — Ambulatory Visit (INDEPENDENT_AMBULATORY_CARE_PROVIDER_SITE_OTHER): Payer: Medicare Other

## 2020-06-19 DIAGNOSIS — R55 Syncope and collapse: Secondary | ICD-10-CM

## 2020-06-21 LAB — CUP PACEART REMOTE DEVICE CHECK
Date Time Interrogation Session: 20220308083853
Implantable Pulse Generator Implant Date: 20210617

## 2020-06-27 NOTE — Progress Notes (Signed)
Carelink Summary Report / Loop Recorder 

## 2020-07-12 ENCOUNTER — Other Ambulatory Visit: Payer: Self-pay

## 2020-07-12 ENCOUNTER — Ambulatory Visit: Payer: Medicare Other | Admitting: Cardiovascular Disease

## 2020-07-12 ENCOUNTER — Encounter: Payer: Self-pay | Admitting: Cardiovascular Disease

## 2020-07-12 VITALS — BP 130/70 | HR 87 | Ht 68.0 in | Wt 176.0 lb

## 2020-07-12 DIAGNOSIS — E7800 Pure hypercholesterolemia, unspecified: Secondary | ICD-10-CM | POA: Diagnosis not present

## 2020-07-12 DIAGNOSIS — I441 Atrioventricular block, second degree: Secondary | ICD-10-CM | POA: Diagnosis not present

## 2020-07-12 NOTE — Progress Notes (Signed)
Cardiology Office Note:    Date:  07/13/2020   ID:  Jacob Kirk, DOB 1924-05-31, MRN [id number]  PCP:  Tracey Harries, MD  Cardiologist:  Thurmon Fair, MD  Electrophysiologist:  None   Referring MD: Tracey Harries, MD   Chief Complaint  Patient presents with  . Loss of Consciousness    History of Present Illness:    Jacob Kirk is a 87 y.o. male with a recent episode of witnessed near syncope (he was walking with his son) on Sep 04, 2019.  He has a history of second-degree AV block Mobitz type I incidentally discovered during a previous hospitalization and received an implantable loop recorder after his near syncopal event.  As always, he is accompanied by his son Jacob Kirk.  Since his last appointment he has not had syncope or near syncope.  He was hospitalized in late December 2021 with pancreatitis of uncertain etiology (no identified gallstones, no alcohol consumption, no new medications, mesenteric ischemia felt to be an unlikely cause, etc.) from which he has recovered well.  The patient specifically denies any chest pain at rest exertion, dyspnea at rest or with exertion, orthopnea, paroxysmal nocturnal dyspnea, syncope, palpitations, focal neurological deficits, intermittent claudication, lower extremity edema, unexplained weight gain, cough, hemoptysis or wheezing.  In 2021 he had a an echocardiogram with normal findings and a nuclear stress test with a questionable inferolateral area of mixed reversible/fixed defect (in my opinion there was no evidence of ischemic abnormalities).  No evidence of pulmonary embolism by CT angiography, but there was heavy atherosclerotic calcification seen in the coronary arteries, particularly in the proximal-mid LAD and throughout the AV groove segment of the left circumflex coronary artery.  Subsequent event monitoring did not show any evidence of second-degree AV block or any other major arrhythmia.  His ECG today is very similar with  previous tracing showing sinus rhythm, first-degree AV block, right bundle branch block, left anterior fascicular block, QTC prolonged at 490 ms.   Past Medical History:  Diagnosis Date  . Bronchitis   . Coronary artery disease   . Spinal stenosis     Past Surgical History:  Procedure Laterality Date  . APPENDECTOMY    . JOINT REPLACEMENT      Current Medications: Current Meds  Medication Sig  . aspirin EC 81 MG EC tablet Take 1 tablet (81 mg total) by mouth daily.  Marland Kitchen atorvastatin (LIPITOR) 40 MG tablet Take 1 tablet (40 mg total) by mouth daily at 6 PM.  . diphenhydrAMINE (BENADRYL) 25 MG tablet Take 25 mg by mouth at bedtime. Takes 25 MG daily  . divalproex (DEPAKOTE) 125 MG DR tablet Take 125 mg by mouth 2 (two) times daily.  . furosemide (LASIX) 20 MG tablet Take by mouth daily.  Marland Kitchen LUMIGAN 0.01 % SOLN Place 1 drop into both eyes daily.  Marland Kitchen OVER THE COUNTER MEDICATION Take by mouth daily. CBD Oil  . XIIDRA 5 % SOLN Place 1 drop into both eyes 2 (two) times daily.   Current Facility-Administered Medications for the 07/12/20 encounter (Office Visit) with Thurmon Fair, MD  Medication  . lidocaine-EPINEPHrine (XYLOCAINE W/EPI) 1 %-1:100000 (with pres) injection 10 mL     Allergies:   Patient has no known allergies.   Social History   Socioeconomic History  . Marital status: Married    Spouse name: Not on file  . Number of children: Not on file  . Years of education: Not on file  . Highest education level: Not on file  Occupational History  . Not on file  Tobacco Use  . Smoking status: Never Smoker  . Smokeless tobacco: Never Used  Substance and Sexual Activity  . Alcohol use: No  . Drug use: No  . Sexual activity: Not on file  Other Topics Concern  . Not on file  Social History Narrative  . Not on file   Social Determinants of Health   Financial Resource Strain: Not on file  Food Insecurity: Not on file  Transportation Needs: Not on file  Physical  Activity: Not on file  Stress: Not on file  Social Connections: Not on file     Family History: The patient's family history is significantly negative for premature Cardiac/vascular disease or arrhythmia  ROS:   Please see the history of present illness.    All other systems are reviewed and are negative.   EKGs/Labs/Other Studies Reviewed:    The following studies were reviewed today: Notes and imaging studies from hospitalization 04/13/2020 in [place].  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  No ischemic changes, QTC 445 ms.  Recent Labs: 11/28/2019: ALT 25; B Natriuretic Peptide 139.7; BUN 34; Creatinine, Ser 1.10; Hemoglobin 12.4; Platelets 194; Potassium 4.0; Sodium 138  Recent Lipid Panel    Component Value Date/Time   CHOL 221 (H) 01/13/2019 0227   TRIG 96 01/13/2019 0227   HDL 39 (L) 01/13/2019 0227   CHOLHDL 5.7 01/13/2019 0227   VLDL 19 01/13/2019 0227   LDLCALC NOT CALCULATED 01/13/2019 0227    Physical Exam:    VS:  BP 130/70   Pulse 87   Ht 5\' 8"  (1.727 m)   Wt 176 lb (79.8 kg)   BMI 26.76 kg/m     Wt Readings from Last 3 Encounters:  07/12/20 176 lb (79.8 kg)  09/21/19 168 lb 6.4 oz (76.4 kg)  02/05/19 177 lb 12.8 oz (80.6 kg)      General: Alert, oriented x3, no distress, lean, appears younger than stated age Head: no evidence of trauma, PERRL, EOMI, no exophtalmos or lid lag, no myxedema, no xanthelasma; normal ears, nose and oropharynx Neck: normal jugular venous pulsations and no hepatojugular reflux; brisk carotid pulses without delay and no carotid bruits Chest: clear to auscultation, no signs of consolidation by percussion or palpation, normal fremitus, symmetrical and full respiratory excursions Cardiovascular: normal position and quality of the apical  impulse, regular rhythm, normal first and widely split second heart sounds, no murmurs, rubs or gallops Abdomen: no tenderness or distention, no masses by palpation, no abnormal pulsatility or arterial bruits, normal bowel sounds, no hepatosplenomegaly Extremities: no clubbing, cyanosis or edema; 2+ radial, ulnar and brachial pulses bilaterally; 2+ right femoral, posterior tibial and dorsalis pedis pulses; 2+ left femoral, posterior tibial and dorsalis pedis pulses; no subclavian or femoral bruits Neurological: grossly nonfocal Psych: Normal mood and affect   ASSESSMENT:    1. Second degree AV block   2. Hypercholesterolemia    PLAN:    In order of problems listed above:  1. 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  If this is identified he will need pacemaker implantation. 2. HLP: Aortic atherosclerosis has been noted on imaging studies but he had  a low risk nuclear stress test and does not have angina pectoris.  Continue statin. 3. Recent idiopathic pancreatitis   Medication Adjustments/Labs and Tests Ordered: Current medicines are reviewed at length with the patient today.  Concerns regarding medicines are outlined above.  Orders Placed This Encounter  Procedures  . EKG 12-Lead   No orders of the defined types were placed in this encounter.   Patient Instructions  Medication Instructions:  No changes *If you need a refill on your cardiac medications before your next appointment, please call your pharmacy*   Lab Work: None ordered If you have labs (blood work) drawn today and your tests are completely normal, you will receive your results only by: Marland Kitchen MyChart Message (if you have MyChart) OR . A paper copy in the mail If you have any lab test that is abnormal or we need to change your treatment, we will  call you to review the results.   Testing/Procedures: None ordered   Follow-Up: At [facility], you and your health needs are our priority.  As part of our continuing mission to provide you with exceptional heart care, we have created designated Provider Care Teams.  These Care Teams include your primary Cardiologist (physician) and Advanced Practice Providers (APPs -  Physician Assistants and Nurse Practitioners) who all work together to provide you with the care you need, when you need it.  We recommend signing up for the patient portal called "MyChart".  Sign up information is provided on this After Visit Summary.  MyChart is used to connect with patients for Virtual Visits (Telemedicine).  Patients are able to view lab/test results, encounter notes, upcoming appointments, etc.  Non-urgent messages can be sent to your provider as well.   To learn more about what you can do with MyChart, go to [email or web address].    Your next appointment:   12 month(s)  The format for your next appointment:   In Person  Provider:   You may see Thurmon Fair, MD or one of the following Advanced Practice Providers on your designated Care Team:    Azalee Course, PA-C  Micah Flesher, [place] or   Judy Pimple, PA-C       Signed, Thurmon Fair, MD  07/13/2020 2:31 PM    [facility]

## 2020-07-12 NOTE — Patient Instructions (Signed)

## 2020-07-13 ENCOUNTER — Encounter: Payer: Self-pay | Admitting: Cardiovascular Disease

## 2020-07-14 ENCOUNTER — Telehealth: Payer: Self-pay

## 2020-07-14 ENCOUNTER — Encounter: Payer: Self-pay | Admitting: *Deleted

## 2020-07-14 DIAGNOSIS — Z01812 Encounter for preprocedural laboratory examination: Secondary | ICD-10-CM

## 2020-07-14 DIAGNOSIS — I441 Atrioventricular block, second degree: Secondary | ICD-10-CM

## 2020-07-14 NOTE — Telephone Encounter (Signed)
I just saw him! He needs a pacemaker. We will start the process of setting that up.

## 2020-07-14 NOTE — Telephone Encounter (Signed)
The son has been called with the instructions. They will also be sent through MyChart.   Any Other Special Instructions Will Be Listed Below (If Applicable).     Implantable Device Instructions  You are scheduled for a  Permanent Transvenous Pacemaker Implant on  07/31/20  with Dr. Royann Shivers.  1.   Please arrive at the [facility], Entrance "A"  at [facility] at 11:30 am on the day of your procedure. (The address is [address]  2. Do not eat or drink after midnight the night before your procedure.  3.   Your provider would like for you to return on 07/27/20 to have the following labs drawn: BMET and CBC at the [facility] office. You do not need an appointment for the lab. Once in our office lobby there is a podium where you can sign in and ring the doorbell to alert [place] that you are here. The lab is open from 8:00 am to 4:30 pm; closed for lunch from 12:45pm-1:45pm.  You will need to have the coronavirus test completed prior to your procedure. An appointment has been made at 8:05 am on 07/27/20. This is a Drive Up Visit at [address] [place], [place], [place] [address]. Please tell them that you are there for procedure testing. Stay in your car and someone will be with you shortly. Please make sure to have all other labs completed before this test because you will need to stay quarantined until your procedure.  4.  Medications: Nothing to hold 5.  Plan for an overnight stay.  Bring your insurance cards and a list of you medications.  6.  Wash your chest and neck with surgical scrub the evening before and the morning of your procedure.  Rinse well. Please review the surgical scrub instruction sheet given to you. You may pick this up at the [facility] office or any drug store.  7. Your chest will need to be shaved prior to this procedure (if needed). We ask that you do this yourself at home 1 to 2 days before or if uncomfortable/unable to do yourself, then it will be performed  by the hospital staff the day of.                                                                                                                * If you have ant questions after you get home, please call Misty Stanley, RN at [phone]  * Every attempt is made to prevent procedures from being rescheduled.  Due to the nature of  Electrophysiology, rescheduling can happen.  The physician is always aware and directs the staff when this occurs.     Pacemaker Implantation, Adult Pacemaker implantation is a procedure to place a pacemaker inside your chest. A pacemaker is a small computer that sends electrical signals to the heart and helps your heart beat normally. A pacemaker also stores information about your heart rhythms. You may need pacemaker implantation if you:  Have a slow heartbeat (bradycardia).  Faint (  syncope).  Have shortness of breath (dyspnea) due to heart problems.  The pacemaker attaches to your heart through a wire, called a lead. Sometimes just one lead is needed. Other times, there will be two leads. There are two types of pacemakers:  Transvenous pacemaker. This type is placed under the skin or muscle of your chest. The lead goes through a vein in the chest area to reach the inside of the heart.  Epicardial pacemaker. This type is placed under the skin or muscle of your chest or belly. The lead goes through your chest to the outside of the heart.  Tell a health care provider about:  Any allergies you have.  All medicines you are taking, including vitamins, herbs, eye drops, creams, and over-the-counter medicines.  Any problems you or family members have had with anesthetic medicines.  Any blood or bone disorders you have.  Any surgeries you have had.  Any medical conditions you have.  Whether you are pregnant or may be pregnant. What are the risks? Generally, this is a safe procedure. However, problems may occur, including:  Infection.  Bleeding.  Failure of  the pacemaker or the lead.  Collapse of a lung or bleeding into a lung.  Blood clot inside a blood vessel with a lead.  Damage to the heart.  Infection inside the heart (endocarditis).  Allergic reactions to medicines.  What happens before the procedure? Staying hydrated Follow instructions from your health care provider about hydration, which may include:  Up to 2 hours before the procedure - you may continue to drink clear liquids, such as water, clear fruit juice, black coffee, and plain tea.  Eating and drinking restrictions Follow instructions from your health care provider about eating and drinking, which may include:  8 hours before the procedure - stop eating heavy meals or foods such as meat, fried foods, or fatty foods.  6 hours before the procedure - stop eating light meals or foods, such as toast or cereal.  6 hours before the procedure - stop drinking milk or drinks that contain milk.  2 hours before the procedure - stop drinking clear liquids.  Medicines  Ask your health care provider about: ? Changing or stopping your regular medicines. This is especially important if you are taking diabetes medicines or blood thinners. ? Taking medicines such as aspirin and ibuprofen. These medicines can thin your blood. Do not take these medicines before your procedure if your health care provider instructs you not to.  You may be given antibiotic medicine to help prevent infection. General instructions  You will have a heart evaluation. This may include an electrocardiogram (ECG), chest X-ray, and heart imaging (echocardiogram,  or echo) tests.  You will have blood tests.  Do not use any products that contain nicotine or tobacco, such as cigarettes and e-cigarettes. If you need help quitting, ask your health care provider.  Plan to have someone take you home from the hospital or clinic.  If you will be going home right after the procedure, plan to have someone with you  for 24 hours.  Ask your health care provider how your surgical site will be marked or identified. What happens during the procedure?  To reduce your risk of infection: ? Your health care team will wash or sanitize their hands. ? Your skin will be washed with soap. ? Hair may be removed from the surgical area.  An IV tube will be inserted into one of your veins.  You will be  given one or more of the following: ? A medicine to help you relax (sedative). ? A medicine to numb the area (local anesthetic). ? A medicine to make you fall asleep (general anesthetic).  If you are getting a transvenous pacemaker: ? An incision will be made in your upper chest. ? A pocket will be made for the pacemaker. It may be placed under the skin or between layers of muscle. ? The lead will be inserted into a blood vessel that returns to the heart. ? While X-rays are taken by an imaging machine (fluoroscopy), the lead will be advanced through the vein to the inside of your heart. ? The other end of the lead will be tunneled under the skin and attached to the pacemaker.  If you are getting an epicardial pacemaker: ? An incision will be made near your ribs or breastbone (sternum) for the lead. ? The lead will be attached to the outside of your heart. ? Another incision will be made in your chest or upper belly to create a pocket for the pacemaker. ? The free end of the lead will be tunneled under the skin and attached to the pacemaker.  The transvenous or epicardial pacemaker will be tested. Imaging studies may be done to check the lead position.  The incisions will be closed with stitches (sutures), adhesive strips, or skin glue.  Bandages (dressing) will be placed over the incisions. The procedure may vary among health care providers and hospitals. What happens after the procedure?  Your blood pressure, heart rate, breathing rate, and blood oxygen level will be monitored until the medicines you were  given have worn off.  You will be given antibiotics and pain medicine.  ECG and chest x-rays will be done.  You will wear a continuous type of ECG (Holter monitor) to check your heart rhythm.  Your health care provider will program the pacemaker.  Do not drive for 24 hours if you received a sedative. This information is not intended to replace advice given to you by your health care provider. Make sure you discuss any questions you have with your health care provider. Document Released: 03/22/2002 Document Revised: 10/20/2015 Document Reviewed: 09/13/2015 Elsevier Interactive Patient Education  2018 [employer].     Pacemaker Implantation, Adult, Care After This sheet gives you information about how to care for yourself after your procedure. Your health care provider may also give you more specific instructions. If you have problems or questions, contact your health care provider. What can I expect after the procedure? After the procedure, it is common to have:  Mild pain.  Slight bruising.  Some swelling over the incision.  A slight bump over the skin where the device was placed. Sometimes, it is possible to feel the device under the skin. This is normal.  Follow these instructions at home: Medicines  Take over-the-counter and prescription medicines only as told by your health care provider.  If you were prescribed an antibiotic medicine, take it as told by your health care provider. Do not stop taking the antibiotic even if you start to feel better. Wound care  Do not remove the bandage on your chest until directed to do so by your health care provider.  After your bandage is removed, you may see pieces of tape called skin adhesive strips over the area where the cut was made (incision site). Let them fall off on their own.  Check the incision site every day to make sure it is not  infected, bleeding, or starting to pull apart.  Do not use lotions or ointments near the  incision site unless directed to do so.  Keep the incision area clean and dry for 2-3 days after the procedure or as directed by your health care provider. It takes several weeks for the incision site to completely heal.  Do not take baths, swim, or use a hot tub for 7-10 days or as otherwise directed by your health care provider. Activity  Do not drive or use heavy machinery while taking prescription pain medicine.  Do not drive for 24 hours if you were given a medicine to help you relax (sedative).  Check with your health care provider before you start to drive or play sports.  Avoid sudden jerking, pulling, or chopping movements that pull your upper arm far away from your body. Avoid these movements for at least 6 weeks or as long as told by your health care provider.  Do not lift your upper arm above your shoulders for at least 6 weeks or as long as told by your health care provider. This means no tennis, golf, or swimming.  You may go back to work when your health care provider says it is okay. Pacemaker care  You may be shown how to transfer data from your pacemaker through the phone to your health care provider.  Always let all health care providers know about your pacemaker before you have any medical procedures or tests.  Wear a medical ID bracelet or necklace stating that you have a pacemaker. Carry a pacemaker ID card with you at all times.  Your pacemaker battery will last for 5-15 years. Routine checks by your health care provider will let the health care provider know when the battery is starting to run down. The pacemaker will need to be replaced when the battery starts to run down.  Do not use amateur Proofreader. Other electrical devices are safe to use, including power tools, lawn mowers, and speakers. If you are unsure of whether something is safe to use, ask your health care provider.  When using your cell phone, hold it to the ear  opposite the pacemaker. Do not leave your cell phone in a pocket over the pacemaker.  Avoid places or objects that have a strong electric or magnetic field, including: ? Airport Actuary. When at the airport, let officials know that you have a pacemaker. ? Power plants. ? Large electrical generators. ? Radiofrequency transmission towers, such as cell phone and radio towers. General instructions  Weigh yourself every day. If you suddenly gain weight, fluid may be building up in your body.  Keep all follow-up visits as told by your health care provider. This is important. Contact a health care provider if:  You gain weight suddenly.  Your legs or feet swell.  It feels like your heart is fluttering or skipping beats (heart palpitations).  You have chills or a fever.  You have more redness, swelling, or pain around your incisions.  You have more fluid or blood coming from your incisions.  Your incisions feel warm to the touch.  You have pus or a bad smell coming from your incisions. Get help right away if:  You have chest pain.  You have trouble breathing or are short of breath.  You become extremely tired.  You are light-headed or you faint. This information is not intended to replace advice given to you by your health care provider. Make  sure you discuss any questions you have with your health care provider. Document Released: 10/19/2004 Document Revised: 01/12/2016 Document Reviewed: 01/12/2016 Elsevier Interactive Patient Education  2018 [employer].    Supplemental Discharge Instructions for  Pacemaker/Defibrillator Patients  ACTIVITY No heavy lifting or vigorous activity with your left/right arm for 6 to 8 weeks.  Do not raise your left/right arm above your head for one week.  Gradually raise your affected arm as drawn below.           __  NO DRIVING for     ; you may begin driving on     .  WOUND CARE - Keep the wound area clean and dry.  Do not get  this area wet for one week. No showers for one week; you may shower on     . - The tape/steri-strips on your wound will fall off; do not pull them off.  No bandage is needed on the site.  DO  NOT apply any creams, oils, or ointments to the wound area. - If you notice any drainage or discharge from the wound, any swelling or bruising at the site, or you develop a fever > 101? F after you are discharged home, call the office at once.  SPECIAL INSTRUCTIONS - You are still able to use cellular telephones; use the ear opposite the side where you have your pacemaker/defibrillator.  Avoid carrying your cellular phone near your device. - When traveling through airports, show security personnel your identification card to avoid being screened in the metal detectors.  Ask the security personnel to use the hand wand. - Avoid arc welding equipment, MRI testing (magnetic resonance imaging), TENS units (transcutaneous nerve stimulators).  Call the office for questions about other devices. - Avoid electrical appliances that are in poor condition or are not properly grounded. - Microwave ovens are safe to be near or to operate.  ADDITIONAL INFORMATION FOR DEFIBRILLATOR PATIENTS SHOULD YOUR DEVICE GO OFF: - If your device goes off ONCE and you feel fine afterward, notify the device clinic nurses. - If your device goes off ONCE and you do not feel well afterward, call 911. - If your device goes off TWICE, call 911. - If your device goes off THREE TIMES IN ONE DAY, call 911.  DO NOT DRIVE YOURSELF OR A FAMILY MEMBER WITH A DEFIBRILLATOR TO THE HOSPITAL--CALL 911.    [place] - Preparing For Surgery  Before surgery, you can play an important role. Because skin is not sterile, your skin needs to be as free of germs as possible. You can reduce the number of germs on your skin by washing with CHG (chlorahexidine gluconate) Soap before surgery.  CHG is an antiseptic cleaner which kills germs and bonds with the  skin to continue killing germs even after washing.   Please do not use if you have an allergy to CHG or antibacterial soaps.  If your skin becomes reddened/irritated stop using the CHG.   Do not shave (including legs and underarms) for at least 48 hours prior to first CHG shower.  It is OK to shave your face.  Please follow these instructions carefully:  1.  Shower the night before surgery and the morning of surgery with CHG.  2.  If you choose to wash your hair, wash your hair first as usual with your normal shampoo.  3.  After you shampoo, rinse your hair and body thoroughly to remove the shampoo.  4.  Use CHG as you  would any other liquid soap.  You can apply CHG directly to the skin and wash gently with a clean washcloth. 5.  Apply the CHG Soap to your body ONLY FROM THE NECK DOWN.  Do not use on open wounds or open sores.  Avoid contact with your eyes, ears, mouth and genitals (private parts).  Wash genitals (private parts) with your normal soap.  6.  Wash thoroughly, paying special attention to the area where your surgery will be performed.  7.  Thoroughly rise your body with warm water from the neck down.   8.  DO NOT shower/wash with your normal soap after using and rinsing off the CHG soap.  9.  Pat yourself dry with a clean towel.           10.  Wear clean pajamas.           11.  Place clean sheets on your bed the night of your first shower and do not sleep with pets.  Day of Surgery: Do not apply any deodorants/lotions.  Please wear clean clothes to the hospital/surgery center.     .Marland Kitchen

## 2020-07-14 NOTE — Addendum Note (Signed)
Addended by: Sandi Mariscal on: 07/14/2020 05:28 PM   Modules accepted: Orders

## 2020-07-14 NOTE — Telephone Encounter (Signed)
[facility] Alert for brady episode on 07/13/2020 at 8:32 AM; ECG suggest 2nd AVB vs 3rd AVB w/ VR 30's bpm.   Pt has known history of 2nd degree AVB, Mobitz 1.    Spoke with pt son, he reports yesterday morning around the time of episode pt became suddenly clammy and weak.  Since that time he has been confused.  Pt has an appt with his PCP this morning at 11:45, son is wondering if that is still needed.   Advised I would forward to Dr. Royann Shivers and try to call him abck before the appt if possible.

## 2020-07-14 NOTE — Telephone Encounter (Signed)
I spoke with pt son, he is going to cancel the PCP appointment at 11:45 this morning and awaiting your call.

## 2020-07-24 ENCOUNTER — Ambulatory Visit (INDEPENDENT_AMBULATORY_CARE_PROVIDER_SITE_OTHER): Payer: Medicare Other

## 2020-07-24 DIAGNOSIS — R55 Syncope and collapse: Secondary | ICD-10-CM

## 2020-07-26 LAB — CUP PACEART REMOTE DEVICE CHECK
Date Time Interrogation Session: 20220410094000
Implantable Pulse Generator Implant Date: 20210617

## 2020-07-27 ENCOUNTER — Other Ambulatory Visit (HOSPITAL_COMMUNITY)
Admission: RE | Admit: 2020-07-27 | Discharge: 2020-07-27 | Disposition: A | Discharge status: Home / Self Care | Payer: Medicare Other | Source: Ambulatory Visit | Attending: Cardiovascular Disease | Admitting: Cardiovascular Disease

## 2020-07-27 DIAGNOSIS — Z01812 Encounter for preprocedural laboratory examination: Secondary | ICD-10-CM | POA: Diagnosis present

## 2020-07-27 DIAGNOSIS — Z20822 Contact with and (suspected) exposure to covid-19: Secondary | ICD-10-CM | POA: Insufficient documentation

## 2020-07-27 LAB — SARS CORONAVIRUS 2 (TAT 6-24 HRS): SARS Coronavirus 2: NEGATIVE

## 2020-07-30 ENCOUNTER — Other Ambulatory Visit: Payer: Self-pay | Admitting: *Deleted

## 2020-07-30 DIAGNOSIS — I441 Atrioventricular block, second degree: Secondary | ICD-10-CM

## 2020-07-30 MED ORDER — SODIUM CHLORIDE 0.9% FLUSH: 3.0000 mL | Freq: Two times a day (BID) | INTRAVENOUS | Status: AC

## 2020-07-31 ENCOUNTER — Other Ambulatory Visit: Payer: Self-pay

## 2020-07-31 ENCOUNTER — Ambulatory Visit (HOSPITAL_COMMUNITY)
Admission: RE | Admit: 2020-07-31 | Discharge: 2020-07-31 | Disposition: A | Discharge status: Home / Self Care | Payer: Medicare Other | Attending: Cardiovascular Disease | Admitting: Cardiovascular Disease

## 2020-07-31 ENCOUNTER — Encounter (HOSPITAL_COMMUNITY)
Admission: RE | Disposition: A | Discharge status: Home / Self Care | Payer: Medicare Other | Source: Home / Self Care | Attending: Cardiovascular Disease

## 2020-07-31 ENCOUNTER — Ambulatory Visit (HOSPITAL_COMMUNITY): Payer: Medicare Other

## 2020-07-31 DIAGNOSIS — E785 Hyperlipidemia, unspecified: Secondary | ICD-10-CM | POA: Diagnosis not present

## 2020-07-31 DIAGNOSIS — I442 Atrioventricular block, complete: Secondary | ICD-10-CM | POA: Diagnosis not present

## 2020-07-31 DIAGNOSIS — I700 Atherosclerosis of aorta: Secondary | ICD-10-CM | POA: Insufficient documentation

## 2020-07-31 DIAGNOSIS — R55 Syncope and collapse: Secondary | ICD-10-CM

## 2020-07-31 DIAGNOSIS — Z7982 Long term (current) use of aspirin: Secondary | ICD-10-CM | POA: Diagnosis not present

## 2020-07-31 DIAGNOSIS — Z95818 Presence of other cardiac implants and grafts: Secondary | ICD-10-CM

## 2020-07-31 DIAGNOSIS — I441 Atrioventricular block, second degree: Secondary | ICD-10-CM | POA: Diagnosis not present

## 2020-07-31 DIAGNOSIS — R001 Bradycardia, unspecified: Secondary | ICD-10-CM | POA: Diagnosis not present

## 2020-07-31 DIAGNOSIS — Z79899 Other long term (current) drug therapy: Secondary | ICD-10-CM | POA: Diagnosis not present

## 2020-07-31 DIAGNOSIS — Z950 Presence of cardiac pacemaker: Secondary | ICD-10-CM

## 2020-07-31 HISTORY — PX: PACEMAKER IMPLANT: EP1218

## 2020-07-31 LAB — BASIC METABOLIC PANEL
Anion gap: 9 (ref 5–15)
BUN: 44 mg/dL — ABNORMAL HIGH (ref 8–23)
CO2: 23 mmol/L (ref 22–32)
Calcium: 9.1 mg/dL (ref 8.9–10.3)
Chloride: 107 mmol/L (ref 98–111)
Creatinine, Ser: 1.11 mg/dL (ref 0.61–1.24)
GFR, Estimated: >60 mL/min (ref >60)
Glucose, Bld: 84 mg/dL (ref 70–99)
Potassium: 4.7 mmol/L (ref 3.5–5.1)
Sodium: 139 mmol/L (ref 135–145)

## 2020-07-31 LAB — CBC
HCT: 39.8 % (ref 39.0–52.0)
Hemoglobin: 12.8 g/dL — ABNORMAL LOW (ref 13.0–17.0)
MCH: 32.2 pg (ref 26.0–34.0)
MCHC: 32.2 g/dL (ref 30.0–36.0)
MCV: 100 fL (ref 80.0–100.0)
Platelets: 196 10*3/uL (ref 150–400)
RBC: 3.98 MIL/uL — ABNORMAL LOW (ref 4.22–5.81)
RDW: 13.6 % (ref 11.5–15.5)
WBC: 4.2 10*3/uL (ref 4.0–10.5)
nRBC: 0 % (ref 0.0–0.2)

## 2020-07-31 SURGERY — PACEMAKER IMPLANT

## 2020-07-31 MED ORDER — NITROGLYCERIN 0.4 MG SL SUBL: 0.4000 mg | SUBLINGUAL_TABLET | SUBLINGUAL | Status: DC | PRN

## 2020-07-31 MED ORDER — CEFAZOLIN SODIUM-DEXTROSE 2-3 GM-%(50ML) IV SOLR
INTRAVENOUS | Status: DC | PRN
  Administered 2020-07-31: 2 g via INTRAVENOUS

## 2020-07-31 MED ORDER — SODIUM CHLORIDE 0.9 % IV SOLN: 250.0000 mL | INTRAVENOUS | Status: DC

## 2020-07-31 MED ORDER — SODIUM CHLORIDE 0.9 % IV SOLN
80.0000 mg | INTRAVENOUS | Status: DC
  Filled 2020-07-31: qty 2

## 2020-07-31 MED ORDER — ACETAMINOPHEN 325 MG PO TABS: 325.0000 mg | ORAL_TABLET | ORAL | Status: DC | PRN

## 2020-07-31 MED ORDER — LIDOCAINE HCL (PF) 1 % IJ SOLN
INTRAMUSCULAR | Status: AC
  Filled 2020-07-31: qty 60

## 2020-07-31 MED ORDER — SODIUM CHLORIDE 0.9 % IV SOLN
INTRAVENOUS | Status: AC
  Filled 2020-07-31: qty 2

## 2020-07-31 MED ORDER — SODIUM CHLORIDE 0.9% FLUSH: 3.0000 mL | INTRAVENOUS | Status: DC | PRN

## 2020-07-31 MED ORDER — CEFAZOLIN SODIUM-DEXTROSE 2-4 GM/100ML-% IV SOLN
2.0000 g | INTRAVENOUS | Status: DC
  Filled 2020-07-31: qty 100

## 2020-07-31 MED ORDER — SODIUM CHLORIDE 0.9 % IV SOLN: INTRAVENOUS | Status: DC

## 2020-07-31 MED ORDER — CHLORHEXIDINE GLUCONATE 4 % EX LIQD
4.0000 "application " | Freq: Once | CUTANEOUS | Status: DC
  Filled 2020-07-31: qty 60

## 2020-07-31 MED ORDER — HEPARIN (PORCINE) IN NACL 1000-0.9 UT/500ML-% IV SOLN
INTRAVENOUS | Status: AC
  Filled 2020-07-31: qty 500

## 2020-07-31 MED ORDER — CEFAZOLIN SODIUM-DEXTROSE 2-4 GM/100ML-% IV SOLN
INTRAVENOUS | Status: AC
  Filled 2020-07-31: qty 100

## 2020-07-31 MED ORDER — ONDANSETRON HCL 4 MG/2ML IJ SOLN: 4.0000 mg | Freq: Four times a day (QID) | INTRAMUSCULAR | Status: DC | PRN

## 2020-07-31 MED ORDER — SODIUM CHLORIDE 0.9 % IV SOLN: 250.0000 mL | INTRAVENOUS | Status: DC | PRN

## 2020-07-31 MED ORDER — SODIUM CHLORIDE 0.9% FLUSH: 3.0000 mL | Freq: Two times a day (BID) | INTRAVENOUS | Status: DC

## 2020-07-31 MED ORDER — LIDOCAINE HCL (PF) 1 % IJ SOLN
INTRAMUSCULAR | Status: DC | PRN
  Administered 2020-07-31: 40 mL

## 2020-07-31 MED ORDER — HEPARIN (PORCINE) IN NACL 1000-0.9 UT/500ML-% IV SOLN
INTRAVENOUS | Status: DC | PRN
  Administered 2020-07-31: 500 mL

## 2020-07-31 MED ORDER — SODIUM CHLORIDE 0.9 % IV SOLN
INTRAVENOUS | Status: DC | PRN
  Administered 2020-07-31: 500 mL

## 2020-07-31 SURGICAL SUPPLY — 9 items
CABLE SURGICAL S-101-97-12 (CABLE) ×2 IMPLANT
IPG PACE AZUR XT DR MRI W1DR01 (Pacemaker) ×1 IMPLANT
LEAD CAPSURE NOVUS 5076-52CM (Lead) ×2 IMPLANT
LEAD CAPSURE NOVUS 5076-58CM (Lead) ×2 IMPLANT
PACE AZURE XT DR MRI W1DR01 (Pacemaker) ×2 IMPLANT
PAD PRO RADIOLUCENT 2001M-C (PAD) ×2 IMPLANT
SHEATH 7FR PRELUDE SNAP 13 (SHEATH) ×4 IMPLANT
SYR CONTROL 10ML ANGIOGRAPHIC (SYRINGE) ×2 IMPLANT
TRAY PACEMAKER INSERTION (PACKS) ×2 IMPLANT

## 2020-07-31 NOTE — H&P (Signed)
Cardiology Office Note:    Date:  07/31/2020   ID:  Jacob Kirk, DOB 06/05/1924, MRN [id number]  PCP:  Tracey Harries, MD  Cardiologist:  Thurmon Fair, MD  Electrophysiologist:  None   Referring MD: No ref. provider found   Chief complaint: presyncope; high grade AV block  History of Present Illness:    Jacob Kirk is a 87 y.o. male with a recent episode of witnessed near syncope (he was walking with his son) on Sep 04, 2019.  He has a history of second-degree AV block Mobitz type I incidentally discovered during a previous hospitalization and received an implantable loop recorder after his near syncopal event.  He is routinely accompanied by his son Jacob Kirk.  [facility] Alert for brady episode on 07/13/2020 at 8:32 AM; ECG suggest 2nd AVB vs 3rd AVB w/ VR 30's bpm.   Patient's son, Jacob Kirk, reports that around the time of episode the patient became suddenly clammy and weak.         He was hospitalized in late December 2021 with pancreatitis of uncertain etiology (no identified gallstones, no alcohol consumption, no new medications, mesenteric ischemia felt to be an unlikely cause, etc.) from which he has recovered well.  The patient specifically denies any chest pain at rest exertion, dyspnea at rest or with exertion, orthopnea, paroxysmal nocturnal dyspnea, syncope, palpitations, focal neurological deficits, intermittent claudication, lower extremity edema, unexplained weight gain, cough, hemoptysis or wheezing.  In 2021 he had a an echocardiogram with normal findings and a nuclear stress test with a questionable inferolateral area of mixed reversible/fixed defect (in my opinion there was no evidence of ischemic abnormalities).  No evidence of pulmonary embolism by CT angiography, but there was heavy atherosclerotic calcification seen in the coronary arteries, particularly in the proximal-mid LAD and throughout the AV groove segment of the left circumflex coronary artery.   Subsequent event monitoring did not show any evidence of second-degree AV block or any other major arrhythmia.  His ECG at the last appointment is very similar with previous tracing showing sinus rhythm, first-degree AV block, right bundle branch block, left anterior fascicular block, QTC prolonged at 490 ms.   Past Medical History:  Diagnosis Date  . Bronchitis   . Coronary artery disease   . Spinal stenosis     Past Surgical History:  Procedure Laterality Date  . APPENDECTOMY    . JOINT REPLACEMENT      Current Medications: Current Facility-Administered Medications for the 07/31/20 encounter [facility] Encounter)  Medication  . lidocaine-EPINEPHrine (XYLOCAINE W/EPI) 1 %-1:100000 (with pres) injection 10 mL  . sodium chloride flush (NS) 0.9 % injection 3 mL   Current Meds  Medication Sig  . acetaminophen (TYLENOL) 500 MG tablet Take 500-1,000 mg by mouth every 6 (six) hours as needed (pain).  Marland Kitchen albuterol (VENTOLIN HFA) 108 (90 Base) MCG/ACT inhaler Inhale 1-2 puffs into the lungs every 6 (six) hours as needed for wheezing or shortness of breath.  Marland Kitchen aspirin EC 81 MG EC tablet Take 1 tablet (81 mg total) by mouth daily. (Patient taking differently: Take 81 mg by mouth every evening.)  . atorvastatin (LIPITOR) 40 MG tablet Take 1 tablet (40 mg total) by mouth daily at 6 PM. (Patient taking differently: Take 40 mg by mouth every evening.)  . CANNABIDIOL PO Place 1 drop under the tongue in the morning. CBD Oil  . diphenhydrAMINE (BENADRYL) 25 MG tablet Take 25 mg by mouth at bedtime as needed for sleep.  . divalproex (DEPAKOTE) 125  MG DR tablet Take 125 mg by mouth 2 (two) times daily.  . furosemide (LASIX) 20 MG tablet Take 20 mg by mouth in the morning.  Marland Kitchen ibuprofen (ADVIL) 200 MG tablet Take 400 mg by mouth in the morning and at bedtime.  Marland Kitchen LUMIGAN 0.01 % SOLN Place 1 drop into both eyes at bedtime.  . mirtazapine (REMERON) 7.5 MG tablet Take 7.5 mg by mouth at bedtime.  Bertram Gala Glyc-Propyl Glyc PF (SYSTANE ULTRA PF) 0.4-0.3 % SOLN Place 1-2 drops into both eyes 3 (three) times daily as needed (dry/irritated eyes).  . risperiDONE (RISPERDAL) 0.25 MG tablet Take 0.25 mg by mouth 2 (two) times daily.  . sertraline (ZOLOFT) 50 MG tablet Take 50 mg by mouth in the morning.  Cliffton Asters Petrolatum-Mineral Oil (SYSTANE NIGHTTIME) OINT Apply 1 application to eye at bedtime.  Marland Kitchen XIIDRA 5 % SOLN Place 1 drop into both eyes 2 (two) times daily.     Allergies:   Patient has no known allergies.   Social History   Socioeconomic History  . Marital status: Married    Spouse name: Not on file  . Number of children: Not on file  . Years of education: Not on file  . Highest education level: Not on file  Occupational History  . Not on file  Tobacco Use  . Smoking status: Never Smoker  . Smokeless tobacco: Never Used  Substance and Sexual Activity  . Alcohol use: No  . Drug use: No  . Sexual activity: Not on file  Other Topics Concern  . Not on file  Social History Narrative  . Not on file   Social Determinants of Health   Financial Resource Strain: Not on file  Food Insecurity: Not on file  Transportation Needs: Not on file  Physical Activity: Not on file  Stress: Not on file  Social Connections: Not on file     Family History: The patient's family history is significantly negative for premature Cardiac/vascular disease or arrhythmia  ROS:   Please see the history of present illness.    All other systems are reviewed and are negative.   EKGs/Labs/Other Studies Reviewed:    The following studies were reviewed today: Notes and imaging studies from hospitalization 04/13/2020 in [facility].  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  No ischemic changes, QTC 445 ms.  Recent  Labs: 11/28/2019: ALT 25; B Natriuretic Peptide 139.7; BUN 34; Creatinine, Ser 1.10; Hemoglobin 12.4; Platelets 194; Potassium 4.0; Sodium 138  Recent Lipid Panel    Component Value Date/Time   CHOL 221 (H) 01/13/2019 0227   TRIG 96 01/13/2019 0227   HDL 39 (L) 01/13/2019 0227   CHOLHDL 5.7 01/13/2019 0227   VLDL 19 01/13/2019 0227   LDLCALC NOT CALCULATED 01/13/2019 0227    Physical Exam:    VS:  BP 136/66   Pulse 80   Temp 97.7 F (36.5 C) (Oral)   Resp 18     Wt Readings from Last 3 Encounters:  07/12/20 79.8 kg  09/21/19 76.4 kg  02/05/19 80.6 kg      General: Alert, oriented x3, no distress Head: no evidence of trauma, PERRL, EOMI, no exophtalmos or lid lag, no myxedema, no xanthelasma; normal ears, nose and oropharynx Neck: normal jugular venous pulsations and no hepatojugular reflux; brisk carotid pulses without delay  and no carotid bruits Chest: clear to auscultation, no signs of consolidation by percussion or palpation, normal fremitus, symmetrical and full respiratory excursions Cardiovascular: normal position and quality of the apical impulse, regular rhythm, normal first and widely split second heart sounds, no murmurs, rubs or gallops Abdomen: no tenderness or distention, no masses by palpation, no abnormal pulsatility or arterial bruits, normal bowel sounds, no hepatosplenomegaly Extremities: no clubbing, cyanosis or edema; 2+ radial, ulnar and brachial pulses bilaterally; 2+ right femoral, posterior tibial and dorsalis pedis pulses; 2+ left femoral, posterior tibial and dorsalis pedis pulses; no subclavian or femoral bruits Neurological: grossly nonfocal Psych: Normal mood and affect   ASSESSMENT:    1. Second degree AV block    PLAN:    In order of problems listed above:  1. 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 2. HLP: Aortic atherosclerosis has been  noted on imaging studies but he had a low risk nuclear stress test and does not have angina pectoris.  Continue statin. 3. History of idiopathic pancreatitis    Jacob Kirk  has presented today for surgery, with the diagnosis of second degree heart block.  The various methods of treatment have been discussed with the patient and family. After consideration of risks, benefits and other options for treatment, the patient has consented to  Procedure(s): PACEMAKER IMPLANT (N/A) as a surgical intervention.  The patient's history has been reviewed, patient examined, no change in status, stable for surgery.  I have reviewed the patient's chart and labs.  Questions were answered to the patient's satisfaction.      Medication Adjustments/Labs and Tests Ordered: Current medicines are reviewed at length with the patient today.  Concerns regarding medicines are outlined above.  Orders Placed This Encounter  Procedures  . Basic metabolic panel  . CBC  . Diet NPO time specified Except for: Sips with Meds  . Informed Consent Details: Physician/Practitioner Attestation; Transcribe to consent form and obtain patient signature  . Initiate Pre-op Protocol  . Void on call to EP Lab  . Electrode Placement Place arm electrodes on posterior shoulders  . Pre-admission testing diagnosis  . SCRUB WITH Chlorhexidine (HIBICLENS) 4%  . Confirm CBC and BMP (or CMP) results within 7 days for inpatient and 30 days for outpatient:  . EP PPM/ICD IMPLANT  . Insert peripheral IV   Meds ordered this encounter  Medications  . gentamicin (GARAMYCIN) 80 mg in sodium chloride 0.9 % 500 mL irrigation  . chlorhexidine (HIBICLENS) 4 % liquid 4 application  . sodium chloride flush (NS) 0.9 % injection 3 mL  . 0.9 %  sodium chloride infusion  . 0.9 %  sodium chloride infusion  . ceFAZolin (ANCEF) IVPB 2g/100 mL premix    Order Specific Question:   Indication:    Answer:   Surgical Prophylaxis    There are no outpatient  Patient Instructions on file for this admission.   Signed, Thurmon Fair, MD  07/31/2020 12:48 PM     [facility]

## 2020-07-31 NOTE — Progress Notes (Addendum)
Per Dr C, pt can leave now, pt has son sitting bedside. Pt is acting a bit confused, son states he gets this way at times in the evening. Pt would not allow anyone of the staff members touch him, he allowed his son to, pt could become aggressive/ sundowners. Pt was dressed and did not state he had to void, pt urinated on his pants while sitting in the chair. Son did not want him changed into a diaper and gown. We put a chux under and over his wet pants, son was very apologetic, he did not want to upset his father any further and wanted to take him home wet. Marland Kitchen

## 2020-07-31 NOTE — Discharge Instructions (Signed)

## 2020-07-31 NOTE — Op Note (Signed)
Procedure report  Procedure performed:  Implantation of new dual chamber permanent pacemaker  Reason for procedure:  Symptomatic bradycardia due to: Second degree atrioventricular block Mobitz type II Intermittent complete heart block   Procedure performed by: Thurmon Fair, MD  Complications: None  Estimated blood loss: <10 mL  Medications administered during procedure: Ancef 2 g intravenously Lidocaine 1% 30 mL locally  Device details: Generator Medtronic Azure XT DR model [id number] serial number [id number] S Right atrial lead Medtronic [id number] serial number [id number] Right ventricular lead Medtronic [id number] serial number [id number]  Procedure details:  After the risks and benefits of the procedure were discussed the patient provided informed consent and was brought to the cardiac cath lab in the fasting state. The patient was prepped and draped in usual sterile fashion. Local anesthesia with 1% lidocaine was administered to to the left infraclavicular area. A 5-6 cm horizontal incision was made parallel with and 2-3 cm caudal to the left clavicle. Using electrocautery and blunt dissection a prepectoral pocket was created down to the level of the pectoralis major muscle fascia. The pocket was carefully inspected for hemostasis. An antibiotic-soaked sponge was placed in the pocket.  Under fluoroscopic guidance and using the modified Seldinger technique 2 separate venipunctures were performed to access the left subclavian vein. No difficulty was encountered accessing the vein.  Two J-tip guidewires were subsequently exchanged for two 7 French safe sheaths.  Under fluoroscopic guidance the ventricular lead was advanced to level of the mid to apical right ventricular septum and thet active-fixation helix was deployed. Fair current of injury was seen. Satisfactory pacing and sensing parameters were recorded. There was no evidence of diaphragmatic stimulation at maximum device output.  The safe sheath was peeled away and the lead was secured in place with 2-0 silk.  In similar fashion the right atrial lead was advanced to the level of the atrial appendage. The active-fixation helix was deployed. There was fair current of injury. Satisfactory  pacing and sensing parameters were recorded. There was no evidence of diaphragmatic stimulation with pacing at maximum device output. The safe sheath was peeled away and the lead was secured in place with 2-0 silk.  The antibiotic-soaked sponge was removed from the pocket. The pocket was flushed with copious amounts of antibiotic solution. Reinspection showed excellent hemostasis..  The ventricular lead was connected to the generator and appropriate ventricular pacing was seen. Subsequently the atrial lead was also connected. Repeat testing of the lead parameters later showed excellent values.  The entire system was then carefully inserted in the pocket with care been taking that the leads and device assumed a comfortable position without pressure on the incision. Great care was taken that the leads be located deep to the generator. The pocket was then closed in layers using 2 layers of 2-0 Vicryl and cutaneous staples, after which a sterile dressing was applied.  At the end of the procedure the following lead parameters were encountered:  Right atrial lead sensed P waves 1.8 mV, impedance 608 ohms, threshold 0.5 V at 0.5 ms pulse width.  Right ventricular lead sensed R waves 15.1 mV, impedance 570 ohms, threshold 0.5 V at 0.5 ms pulse width.   Thurmon Fair, MD, [facility] [phone] office [phone] pager

## 2020-08-01 ENCOUNTER — Encounter (HOSPITAL_COMMUNITY): Payer: Self-pay | Admitting: Cardiovascular Disease

## 2020-08-08 NOTE — Progress Notes (Signed)
Carelink Summary Report / Loop Recorder 

## 2020-08-10 ENCOUNTER — Ambulatory Visit: Payer: Medicare Other

## 2020-08-15 ENCOUNTER — Other Ambulatory Visit: Payer: Self-pay

## 2020-08-15 ENCOUNTER — Ambulatory Visit (INDEPENDENT_AMBULATORY_CARE_PROVIDER_SITE_OTHER): Payer: Medicare Other | Admitting: Emergency Medicine

## 2020-08-15 DIAGNOSIS — I441 Atrioventricular block, second degree: Secondary | ICD-10-CM

## 2020-08-15 DIAGNOSIS — I453 Trifascicular block: Secondary | ICD-10-CM | POA: Diagnosis not present

## 2020-08-15 LAB — CUP PACEART INCLINIC DEVICE CHECK
Battery Remaining Longevity: 151 mo
Battery Voltage: 3.22 V
Brady Statistic AP VP Percent: 16.72 %
Brady Statistic AP VS Percent: 29.62 %
Brady Statistic AS VP Percent: 5.99 %
Brady Statistic AS VS Percent: 47.67 %
Brady Statistic RA Percent Paced: 45.12 %
Brady Statistic RV Percent Paced: 22.72 %
Date Time Interrogation Session: 20220503103444
Implantable Lead Implant Date: 20220418
Implantable Lead Implant Date: 20220418
Implantable Lead Location: 753859
Implantable Lead Location: 753860
Implantable Lead Model: 5076
Implantable Lead Model: 5076
Implantable Pulse Generator Implant Date: 20220418
Lead Channel Impedance Value: 304 Ohm
Lead Channel Impedance Value: 380 Ohm
Lead Channel Impedance Value: 380 Ohm
Lead Channel Impedance Value: 437 Ohm
Lead Channel Pacing Threshold Amplitude: 0.75 V
Lead Channel Pacing Threshold Amplitude: 1 V
Lead Channel Pacing Threshold Pulse Width: 0.4 ms
Lead Channel Pacing Threshold Pulse Width: 0.4 ms
Lead Channel Sensing Intrinsic Amplitude: 14.75 mV
Lead Channel Sensing Intrinsic Amplitude: 2.5 mV
Lead Channel Setting Pacing Amplitude: 3.5 V
Lead Channel Setting Pacing Amplitude: 3.5 V
Lead Channel Setting Pacing Pulse Width: 0.4 ms
Lead Channel Setting Sensing Sensitivity: 0.9 mV

## 2020-08-15 NOTE — Progress Notes (Signed)
Wound check appointment. Steri-strips removed. Wound without redness or edema. Incision edges approximated, wound well healed. Normal device function. Thresholds, sensing, and impedances consistent with implant measurements. Device programmed at 3.5V for extra safety margin until 3 month visit. Histogram distribution appropriate for patient and level of activity. 2 AT/AF episodes, both <1 minute on 08/07/20, appear AF/AFl with peak A rate of 429, average A rate 256, ventricular rates controlled by pacing.  Patient does not have documented history.  No high ventricular rates noted. Patient educated about wound care, arm mobility, lifting restrictions. Patient is enrolled in remote monitoring, next scheduled check 10/30/20.  ROV with Dr. Royann Shivers on 11/27/20.

## 2020-08-15 NOTE — Progress Notes (Signed)
Thanks. Since episodes are brief, no changes at this point.

## 2020-08-26 ENCOUNTER — Other Ambulatory Visit: Payer: Self-pay | Admitting: Cardiovascular Disease

## 2020-09-05 ENCOUNTER — Encounter (INDEPENDENT_AMBULATORY_CARE_PROVIDER_SITE_OTHER): Payer: Self-pay | Admitting: Ophthalmology

## 2020-09-05 ENCOUNTER — Other Ambulatory Visit: Payer: Self-pay

## 2020-09-05 ENCOUNTER — Ambulatory Visit (INDEPENDENT_AMBULATORY_CARE_PROVIDER_SITE_OTHER): Payer: Medicare Other | Admitting: Ophthalmology

## 2020-09-05 DIAGNOSIS — H353133 Nonexudative age-related macular degeneration, bilateral, advanced atrophic without subfoveal involvement: Secondary | ICD-10-CM

## 2020-09-05 DIAGNOSIS — H353134 Nonexudative age-related macular degeneration, bilateral, advanced atrophic with subfoveal involvement: Secondary | ICD-10-CM | POA: Diagnosis not present

## 2020-09-05 NOTE — Assessment & Plan Note (Signed)

## 2020-09-05 NOTE — Progress Notes (Signed)
09/05/2020     CHIEF COMPLAINT Patient presents for Retina Follow Up (6 Mo F/U OU//Pt's son reports pt seems to be having a hard time "pulling together center vision" OU. Pt c/o occasional soreness OU.)   HISTORY OF PRESENT ILLNESS: Jacob Kirk is a 87 y.o. male who presents to the clinic today for:   HPI    Retina Follow Up    Diagnosis: Dry AMD   Laterality: both eyes   Onset: 6 months ago   Severity: severe   Duration: 6 months   Course: gradually worsening   Comments: 6 Mo F/U OU  Pt's son reports pt seems to be having a hard time "pulling together center vision" OU. Pt c/o occasional soreness OU.       Last edited by Ileana Roup, COA on 09/05/2020 10:27 AM. (History)      Referring physician: Tracey Harries, MD [address] [place],  [place] [address]  HISTORICAL INFORMATION:   Selected notes from the MEDICAL RECORD NUMBER       CURRENT MEDICATIONS: Current Outpatient Medications (Ophthalmic Drugs)  Medication Sig  . LUMIGAN 0.01 % SOLN Place 1 drop into both eyes at bedtime.  Bertram Gala Glyc-Propyl Glyc PF (SYSTANE ULTRA PF) 0.4-0.3 % SOLN Place 1-2 drops into both eyes 3 (three) times daily as needed (dry/irritated eyes).  Marland Kitchen XIIDRA 5 % SOLN Place 1 drop into both eyes 2 (two) times daily.  Cliffton Asters Petrolatum-Mineral Oil (SYSTANE NIGHTTIME) OINT Apply 1 application to eye at bedtime.   No current facility-administered medications for this visit. (Ophthalmic Drugs)   Current Outpatient Medications (Other)  Medication Sig  . acetaminophen (TYLENOL) 500 MG tablet Take 500-1,000 mg by mouth every 6 (six) hours as needed (pain).  Marland Kitchen albuterol (VENTOLIN HFA) 108 (90 Base) MCG/ACT inhaler Inhale 1-2 puffs into the lungs every 6 (six) hours as needed for wheezing or shortness of breath.  Marland Kitchen aspirin EC 81 MG EC tablet Take 1 tablet (81 mg total) by mouth daily. (Patient taking differently: Take 81 mg by mouth every evening.)  . atorvastatin  (LIPITOR) 40 MG tablet TAKE 1 TABLET (40 MG TOTAL) BY MOUTH DAILY AT 6 PM.  . CANNABIDIOL PO Place 1 drop under the tongue in the morning. CBD Oil  . diphenhydrAMINE (BENADRYL) 25 MG tablet Take 25 mg by mouth at bedtime as needed for sleep.  . divalproex (DEPAKOTE) 125 MG DR tablet Take 125 mg by mouth 2 (two) times daily.  . furosemide (LASIX) 20 MG tablet Take 20 mg by mouth in the morning.  Marland Kitchen ibuprofen (ADVIL) 200 MG tablet Take 400 mg by mouth in the morning and at bedtime.  . mirtazapine (REMERON) 7.5 MG tablet Take 7.5 mg by mouth at bedtime.  . nitroGLYCERIN (NITROSTAT) 0.4 MG SL tablet Place 1 tablet (0.4 mg total) under the tongue every 5 (five) minutes as needed for chest pain.  Marland Kitchen risperiDONE (RISPERDAL) 0.25 MG tablet Take 0.25 mg by mouth 2 (two) times daily.  . sertraline (ZOLOFT) 50 MG tablet Take 50 mg by mouth in the morning.   Current Facility-Administered Medications (Other)  Medication Route  . lidocaine-EPINEPHrine (XYLOCAINE W/EPI) 1 %-1:100000 (with pres) injection 10 mL Infiltration  . sodium chloride flush (NS) 0.9 % injection 3 mL Intravenous      REVIEW OF SYSTEMS:    ALLERGIES No Known Allergies  PAST MEDICAL HISTORY Past Medical History:  Diagnosis Date  . Bronchitis   . Coronary artery disease   .  Spinal stenosis    Past Surgical History:  Procedure Laterality Date  . APPENDECTOMY    . JOINT REPLACEMENT    . PACEMAKER IMPLANT N/A 07/31/2020   Procedure: PACEMAKER IMPLANT;  Surgeon: Thurmon Fair, MD;  Location: [facility];  Service: Cardiovascular;  Laterality: N/A;    FAMILY HISTORY History reviewed. No pertinent family history.  SOCIAL HISTORY Social History   Tobacco Use  . Smoking status: Never Smoker  . Smokeless tobacco: Never Used  Substance Use Topics  . Alcohol use: No  . Drug use: No         OPHTHALMIC EXAM: Base Eye Exam    Visual Acuity (ETDRS)      Right Left   Dist [place] CF @ 5' CF @ 1'   Dist ph [place] NI NI        Tonometry (Tonopen, 10:32 AM)      Right Left   Pressure 14 12       Pupils      Dark Light Shape React APD   Right 4 4 Irregular Minimal None   Left 4 4 Round Minimal None       Visual Fields (Counting fingers)      Left Right    Full Full       Extraocular Movement      Right Left    Full Full       Neuro/Psych    Oriented x3: Yes   Mood/Affect: Normal       Dilation    Both eyes: 1.0% Mydriacyl, 2.5% Phenylephrine @ 10:32 AM        Slit Lamp and Fundus Exam    External Exam      Right Left   External Normal Normal       Slit Lamp Exam      Right Left   Lids/Lashes Normal Normal   Conjunctiva/Sclera White and quiet White and quiet   Cornea Clear Clear   Anterior Chamber Deep and quiet Deep and quiet   Iris Round and reactive Round and reactive   Lens Posterior chamber intraocular lens Posterior chamber intraocular lens   Anterior Vitreous Normal Normal       Fundus Exam      Right Left   Posterior Vitreous Posterior vitreous detachment Posterior vitreous detachment   Disc Peripapillary atrophy Peripapillary atrophy   C/D Ratio 0.6 0.5   Macula Disciform scar, Atrophy, Geographic atrophy approximately 9 disc areas and some Disciform scar, Atrophy, Geographic atrophy approximately 15-20 disc areas in size   Vessels Normal Normal   Periphery Normal Normal          IMAGING AND PROCEDURES  Imaging and Procedures for 09/05/20  OCT, Retina - OU - Both Eyes       Right Eye Quality was borderline. Scan locations included subfoveal. Central Foveal Thickness: 245. Findings include abnormal foveal contour, choroidal neovascular membrane, subretinal scarring.   Left Eye Quality was good. Scan locations included subfoveal. Central Foveal Thickness: 257. Findings include abnormal foveal contour, choroidal neovascular membrane, subretinal scarring.   Notes No active lesion edges present in either eye.                 ASSESSMENT/PLAN:  Advanced nonexudative age-related macular degeneration of both eyes with subfoveal involvement The nature of dry age related macular degeneration was discussed with the patient as well as its possible conversion to wet. The results of the AREDS 2 study was discussed with the patient.  A diet rich in dark leafy green vegetables was advised and specific recommendations were made regarding supplements with AREDS 2 formulation . Control of hypertension and serum cholesterol may slow the disease. Smoking cessation is mandatory to slow the disease and diminish the risk of progressing to wet age related macular degeneration. The patient was instructed in the use of an Amsler Grid and was told to return immediately for any changes in the Grid. Stressed to the patient do not rub eyes      ICD-10-CM   1. Advanced nonexudative age-related macular degeneration of both eyes without subfoveal involvement  H35.3133 OCT, Retina - OU - Both Eyes  2. Advanced nonexudative age-related macular degeneration of both eyes with subfoveal involvement  H35.3134     1.  No therapy indicated  2.  3.  Ophthalmic Meds Ordered this visit:  No orders of the defined types were placed in this encounter.      Return in about 1 year (around 09/05/2021) for DILATE OU, COLOR FP.  There are no Patient Instructions on file for this visit.   Explained the diagnoses, plan, and follow up with the patient and they expressed understanding.  Patient expressed understanding of the importance of proper follow up care.   Alford Highland Joliana Claflin M.D. Diseases & Surgery of the Retina and Vitreous [facility] 09/05/20     Abbreviations: M myopia (nearsighted); A astigmatism; H hyperopia (farsighted); P presbyopia; Mrx spectacle prescription;  CTL contact lenses; OD right eye; OS left eye; OU both eyes  XT exotropia; ET esotropia; PEK punctate epithelial keratitis; PEE punctate epithelial erosions; DES  dry eye syndrome; MGD meibomian gland dysfunction; ATs artificial tears; PFAT's preservative free artificial tears; NSC nuclear sclerotic cataract; PSC posterior subcapsular cataract; ERM epi-retinal membrane; PVD posterior vitreous detachment; RD retinal detachment; DM diabetes mellitus; DR diabetic retinopathy; NPDR non-proliferative diabetic retinopathy; PDR proliferative diabetic retinopathy; CSME clinically significant macular edema; DME diabetic macular edema; dbh dot blot hemorrhages; CWS cotton wool spot; POAG primary open angle glaucoma; C/D cup-to-disc ratio; HVF humphrey visual field; GVF goldmann visual field; OCT optical coherence tomography; IOP intraocular pressure; BRVO Branch retinal vein occlusion; CRVO central retinal vein occlusion; CRAO central retinal artery occlusion; BRAO branch retinal artery occlusion; RT retinal tear; SB scleral buckle; PPV pars plana vitrectomy; VH Vitreous hemorrhage; PRP panretinal laser photocoagulation; IVK intravitreal kenalog; VMT vitreomacular traction; MH Macular hole;  NVD neovascularization of the disc; NVE neovascularization elsewhere; AREDS age related eye disease study; ARMD age related macular degeneration; POAG primary open angle glaucoma; EBMD epithelial/anterior basement membrane dystrophy; ACIOL anterior chamber intraocular lens; IOL intraocular lens; PCIOL posterior chamber intraocular lens; Phaco/IOL phacoemulsification with intraocular lens placement; PRK photorefractive keratectomy; LASIK laser assisted in situ keratomileusis; HTN hypertension; DM diabetes mellitus; COPD chronic obstructive pulmonary disease

## 2020-10-30 ENCOUNTER — Ambulatory Visit (INDEPENDENT_AMBULATORY_CARE_PROVIDER_SITE_OTHER): Payer: Medicare Other

## 2020-10-30 DIAGNOSIS — R55 Syncope and collapse: Secondary | ICD-10-CM | POA: Diagnosis not present

## 2020-10-30 DIAGNOSIS — I441 Atrioventricular block, second degree: Secondary | ICD-10-CM

## 2020-10-31 LAB — CUP PACEART REMOTE DEVICE CHECK
Battery Remaining Longevity: 145 mo
Battery Voltage: 3.18 V
Brady Statistic AP VP Percent: 27.17 %
Brady Statistic AP VS Percent: 26.2 %
Brady Statistic AS VP Percent: 7.91 %
Brady Statistic AS VS Percent: 38.72 %
Brady Statistic RA Percent Paced: 52.62 %
Brady Statistic RV Percent Paced: 35.08 %
Date Time Interrogation Session: 20220718015258
Implantable Lead Implant Date: 20220418
Implantable Lead Implant Date: 20220418
Implantable Lead Location: 753859
Implantable Lead Location: 753860
Implantable Lead Model: 5076
Implantable Lead Model: 5076
Implantable Pulse Generator Implant Date: 20220418
Lead Channel Impedance Value: 285 Ohm
Lead Channel Impedance Value: 323 Ohm
Lead Channel Impedance Value: 361 Ohm
Lead Channel Impedance Value: 437 Ohm
Lead Channel Pacing Threshold Amplitude: 0.75 V
Lead Channel Pacing Threshold Amplitude: 0.75 V
Lead Channel Pacing Threshold Pulse Width: 0.4 ms
Lead Channel Pacing Threshold Pulse Width: 0.4 ms
Lead Channel Sensing Intrinsic Amplitude: 11.875 mV
Lead Channel Sensing Intrinsic Amplitude: 11.875 mV
Lead Channel Sensing Intrinsic Amplitude: 2.5 mV
Lead Channel Sensing Intrinsic Amplitude: 2.5 mV
Lead Channel Setting Pacing Amplitude: 3.5 V
Lead Channel Setting Pacing Amplitude: 3.5 V
Lead Channel Setting Pacing Pulse Width: 0.4 ms
Lead Channel Setting Sensing Sensitivity: 0.9 mV

## 2020-11-09 DIAGNOSIS — R5383 Other fatigue: Secondary | ICD-10-CM

## 2020-11-09 DIAGNOSIS — R0602 Shortness of breath: Secondary | ICD-10-CM

## 2020-11-11 LAB — BASIC METABOLIC PANEL
BUN/Creatinine Ratio: 26 — ABNORMAL HIGH (ref 10–24)
BUN: 29 mg/dL (ref 10–36)
CO2: 22 mmol/L (ref 20–29)
Calcium: 8.8 mg/dL (ref 8.6–10.2)
Chloride: 105 mmol/L (ref 96–106)
Creatinine, Ser: 1.1 mg/dL (ref 0.76–1.27)
Glucose: 104 mg/dL — ABNORMAL HIGH (ref 65–99)
Potassium: 4.6 mmol/L (ref 3.5–5.2)
Sodium: 142 mmol/L (ref 134–144)
eGFR: 63 mL/min/{1.73_m2} (ref >59)

## 2020-11-11 LAB — CBC
Hematocrit: 33.7 % — ABNORMAL LOW (ref 37.5–51.0)
Hemoglobin: 11.8 g/dL — ABNORMAL LOW (ref 13.0–17.7)
MCH: 32.6 pg (ref 26.6–33.0)
MCHC: 35 g/dL (ref 31.5–35.7)
MCV: 93 fL (ref 79–97)
Platelets: 179 10*3/uL (ref 150–450)
RBC: 3.62 x10E6/uL — ABNORMAL LOW (ref 4.14–5.80)
RDW: 12.9 % (ref 11.6–15.4)
WBC: 4.8 10*3/uL (ref 3.4–10.8)

## 2020-11-21 NOTE — Progress Notes (Signed)
Remote pacemaker transmission.   

## 2020-11-27 ENCOUNTER — Ambulatory Visit (INDEPENDENT_AMBULATORY_CARE_PROVIDER_SITE_OTHER): Payer: Medicare Other | Admitting: Cardiovascular Disease

## 2020-11-27 ENCOUNTER — Encounter: Payer: Self-pay | Admitting: Cardiovascular Disease

## 2020-11-27 ENCOUNTER — Telehealth: Payer: Self-pay

## 2020-11-27 ENCOUNTER — Other Ambulatory Visit: Payer: Self-pay

## 2020-11-27 VITALS — BP 116/64 | HR 70 | Ht 67.0 in | Wt 166.2 lb

## 2020-11-27 DIAGNOSIS — I480 Paroxysmal atrial fibrillation: Secondary | ICD-10-CM

## 2020-11-27 DIAGNOSIS — E7800 Pure hypercholesterolemia, unspecified: Secondary | ICD-10-CM | POA: Diagnosis not present

## 2020-11-27 DIAGNOSIS — I441 Atrioventricular block, second degree: Secondary | ICD-10-CM | POA: Diagnosis not present

## 2020-11-27 DIAGNOSIS — Z950 Presence of cardiac pacemaker: Secondary | ICD-10-CM | POA: Diagnosis not present

## 2020-11-27 NOTE — Telephone Encounter (Signed)
Spoke to patients son Minerva Areola. Device clinic apt. Made to enable AF alerts 11/28/20 @ 4:00 pm.   Dr. Ernest Pine, the current protocol with no known AF is program AF alerts for 6 hours. With known AF, program to 24 hours. Want to clarify?

## 2020-11-27 NOTE — Progress Notes (Signed)
Cardiology Office Note:    Date:  11/29/2020   ID:  [name], DOB May 04, 1924, MRN [id number]  PCP:  Tracey Harries, MD  Cardiologist:  Thurmon Fair, MD  Electrophysiologist:  None   Referring MD: Tracey Harries, MD   Chief complaint: presyncope; high grade AV block  History of Present Illness:    Jacob Kirk is a 87 y.o. male with a history of near syncope and second-degree AV block Mobitz type I, possible intermittent 3rd degree AV block, s/p idual chamber pacemaker (Medtronic Azure April 2022)  He is routinely accompanied by his son [name].  Had some problems with weakness and fatigue during the very warm days of July, now better.  The patient specifically denies any chest pain at rest exertion, dyspnea at rest or with exertion, orthopnea, paroxysmal nocturnal dyspnea, syncope, palpitations, focal neurological deficits, intermittent claudication, lower extremity edema, unexplained weight gain, cough, hemoptysis or wheezing.   The pacemaker site is well healed. Battery longevity is estimated at 12 years. He has 43% A pacing and 30% V pacing. The device has recorded two episodes of sustained, but brief paroxysmal atrial fibrillation, lasting less than 20 minutes. The episodes were spontaneously rate controlled and asymptomatic. He has no history of stroke or TIA (essentially embolic risk is defined only by his advanced age). He does not have HTN, DM, clinically evident CAD or PAD (but does have calcific atherosclerosis in the coronary distribution and the aorta on non-cardiac chest CTA)nd he has normal LV function. CHADSVasc score is 2-3.  He was hospitalized in late December 2021 with pancreatitis of uncertain etiology (no identified gallstones, no alcohol consumption, no new medications, mesenteric ischemia felt to be an unlikely cause, etc.) from which he has recovered well.   In 2021 he had a an echocardiogram with normal findings and a nuclear stress test with a questionable  inferolateral area of mixed reversible/fixed defect (in my opinion there was no evidence of ischemic abnormalities).  No evidence of pulmonary embolism by CT angiography, but there was heavy atherosclerotic calcification seen in the coronary arteries, particularly in the proximal-mid LAD and throughout the AV groove segment of the left circumflex coronary artery.  Subsequent event monitoring did not show any evidence of second-degree AV block or any other major arrhythmia.   Past Medical History:  Diagnosis Date   Bronchitis    Coronary artery disease    Spinal stenosis     Past Surgical History:  Procedure Laterality Date   APPENDECTOMY     JOINT REPLACEMENT     PACEMAKER IMPLANT N/A 07/31/2020   Procedure: PACEMAKER IMPLANT;  Surgeon: Thurmon Fair, MD;  Location: [facility];  Service: Cardiovascular;  Laterality: N/A;    Current Medications: Current Meds  Medication Sig   acetaminophen (TYLENOL) 500 MG tablet Take 500-1,000 mg by mouth every 6 (six) hours as needed (pain).   albuterol (VENTOLIN HFA) 108 (90 Base) MCG/ACT inhaler Inhale 1-2 puffs into the lungs every 6 (six) hours as needed for wheezing or shortness of breath.   aspirin EC 81 MG EC tablet Take 1 tablet (81 mg total) by mouth daily. (Patient taking differently: Take 81 mg by mouth every evening.)   atorvastatin (LIPITOR) 40 MG tablet TAKE 1 TABLET (40 MG TOTAL) BY MOUTH DAILY AT 6 PM.   CANNABIDIOL PO Place 1 drop under the tongue in the morning. CBD Oil   divalproex (DEPAKOTE) 125 MG DR tablet Take 125 mg by mouth 2 (two) times daily.   furosemide (  LASIX) 20 MG tablet Take 20 mg by mouth in the morning.   ibuprofen (ADVIL) 200 MG tablet Take 400 mg by mouth in the morning and at bedtime.   LUMIGAN 0.01 % SOLN Place 1 drop into both eyes at bedtime.   mirtazapine (REMERON) 7.5 MG tablet Take 7.5 mg by mouth at bedtime.   Polyethyl Glyc-Propyl Glyc PF (SYSTANE ULTRA PF) 0.4-0.3 % SOLN Place 1-2 drops into both  eyes 3 (three) times daily as needed (dry/irritated eyes).   risperiDONE (RISPERDAL) 0.25 MG tablet Take 0.25 mg by mouth 2 (two) times daily.   senna-docusate (SENOKOT-S) 8.6-50 MG tablet Take 1 tablet by mouth daily as needed.   sertraline (ZOLOFT) 50 MG tablet Take 50 mg by mouth in the morning.   traMADol (ULTRAM) 50 MG tablet Take 50 mg by mouth every 8 (eight) hours as needed. Patient takes 1 tablet twice daily.   White Petrolatum-Mineral Oil (SYSTANE NIGHTTIME) OINT Apply 1 application to eye at bedtime.   XIIDRA 5 % SOLN Place 1 drop into both eyes 2 (two) times daily.   Current Facility-Administered Medications for the 11/27/20 encounter (Office Visit) with Merian Wroe, Rachelle HoraMihai, MD  Medication   lidocaine-EPINEPHrine (XYLOCAINE W/EPI) 1 %-1:100000 (with pres) injection 10 mL   sodium chloride flush (NS) 0.9 % injection 3 mL     Allergies:   Patient has no known allergies.   Social History   Socioeconomic History   Marital status: Married    Spouse name: Not on file   Number of children: Not on file   Years of education: Not on file   Highest education level: Not on file  Occupational History   Not on file  Tobacco Use   Smoking status: Never   Smokeless tobacco: Never  Substance and Sexual Activity   Alcohol use: No   Drug use: No   Sexual activity: Not on file  Other Topics Concern   Not on file  Social History Narrative   Not on file   Social Determinants of Health   Financial Resource Strain: Not on file  Food Insecurity: Not on file  Transportation Needs: Not on file  Physical Activity: Not on file  Stress: Not on file  Social Connections: Not on file     Family History: The patient's family history is significantly negative for premature Cardiac/vascular disease or arrhythmia  ROS:   Please see the history of present illness.    All other systems are reviewed and are negative.   EKGs/Labs/Other Studies Reviewed:    The following studies were reviewed  today: Comprehensive pacemaker check.  EKG: Current intracardiac electrogram shows A sensed, V paced rhythm. 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  No ischemic changes, QTC 445 ms.  Recent Labs: 11/10/2020: BUN 29; Creatinine, Ser 1.10; Hemoglobin 11.8; Platelets 179; Potassium 4.6; Sodium 142  Recent Lipid Panel    Component Value Date/Time   CHOL 221 (H) 01/13/2019 0227   TRIG 96 01/13/2019 0227   HDL 39 (L) 01/13/2019 0227   CHOLHDL 5.7 01/13/2019 0227   VLDL 19 01/13/2019 0227   LDLCALC NOT CALCULATED 01/13/2019 0227    Physical Exam:    VS:  BP 116/64 (BP Location: Left Arm, Patient Position: Sitting, Cuff Size: Normal)   Pulse 70   Ht 5\' 7"  (1.702 m)   Wt 166 lb 3.2 oz (75.4 kg)   SpO2 95%  BMI 26.03 kg/m     Wt Readings from Last 3 Encounters:  11/27/20 166 lb 3.2 oz (75.4 kg)  07/12/20 176 lb (79.8 kg)  09/21/19 168 lb 6.4 oz (76.4 kg)     General: Alert, oriented x3, no distress, well-healed pacemaker site L subclavian Head: no evidence of trauma, PERRL, EOMI, no exophtalmos or lid lag, no myxedema, no xanthelasma; normal ears, nose and oropharynx Neck: normal jugular venous pulsations and no hepatojugular reflux; brisk carotid pulses without delay and no carotid bruits Chest: clear to auscultation, no signs of consolidation by percussion or palpation, normal fremitus, symmetrical and full respiratory excursions Cardiovascular: normal position and quality of the apical impulse, regular rhythm, normal first and second heart sounds, no murmurs, rubs or gallops Abdomen: no tenderness or distention, no masses by palpation, no abnormal pulsatility or arterial bruits, normal bowel sounds, no hepatosplenomegaly Extremities: no clubbing, cyanosis or edema; 2+ radial, ulnar and brachial pulses bilaterally; 2+ right femoral, posterior tibial  and dorsalis pedis pulses; 2+ left femoral, posterior tibial and dorsalis pedis pulses; no subclavian or femoral bruits Neurological: grossly nonfocal Psych: Normal mood and affect   ASSESSMENT:    1. Paroxysmal atrial fibrillation (HCC)   2. Second degree AV block   3. Pacemaker   4. Hypercholesterolemia     PLAN:    In order of problems listed above:  AFib: episodes are very short and overall burden <0.1%. Had a lengthy discussion regarding the purpose of anticoagulants, and relative balance of embolic stroke prevention versus bleeding risk, unfortunately both problems increased due to his advanced age.  We will continue to monitor the burden of arrhythmia.  His pacemaker.  He should promptly report even a very brief episode of focal neurological deficit. 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 No syncope since pacemaker implantation. Pacemaker: Normal device function.  Roughly 30% ventricular pacing.  No syncope since device implantation.  Remote downloads every 3 months and yearly office visits. HLP: Aortic  and coronary atherosclerosis has been noted on imaging studies but he had a low risk nuclear stress test and does not have angina pectoris.  Continue statin.  He is due for repeat lipid profile. History of idiopathic pancreatitis     Medication Adjustments/Labs and Tests Ordered: Current medicines are reviewed at length with the patient today.  Concerns regarding medicines are outlined above.  No orders of the defined types were placed in this encounter.  No orders of the defined types were placed in this encounter.   Patient Instructions  Medication Instructions:  No changes *If you need a refill on your cardiac medications before your next appointment, please call your pharmacy*   Lab Work: None ordered If you have labs (blood work) drawn today and your tests are  completely normal, you will receive your results only by: MyChart Message (if you have MyChart) OR A paper copy in the mail If you have any lab test that is abnormal or we need to change your treatment, we will call you to review the results.   Testing/Procedures: None ordered   Follow-Up: At [facility], you and your health needs are our priority.  As part of our continuing mission to provide you with exceptional heart care, we have created designated Provider Care Teams.  These Care Teams include your primary Cardiologist (physician) and Advanced Practice Providers (APPs -  Physician Assistants and Nurse Practitioners) who all work together to  provide you with the care you need, when you need it.  We recommend signing up for the patient portal called "MyChart".  Sign up information is provided on this After Visit Summary.  MyChart is used to connect with patients for Virtual Visits (Telemedicine).  Patients are able to view lab/test results, encounter notes, upcoming appointments, etc.  Non-urgent messages can be sent to your provider as well.   To learn more about what you can do with MyChart, go to [email or web address].    Your next appointment:   6 month(s)  The format for your next appointment:   In Person  Provider:   Thurmon Fair, MD    Signed, Thurmon Fair, MD  11/29/2020 9:05 PM    [facility]

## 2020-11-27 NOTE — Telephone Encounter (Signed)
Noted  

## 2020-11-27 NOTE — Telephone Encounter (Signed)
-----   Message from Thurmon Fair, MD sent at 11/27/2020 11:29 AM EDT ----- Can we set up an AFib alert for his device, please? I forget what the cutoffs are. 2 hour episode would be fine.

## 2020-11-27 NOTE — Patient Instructions (Signed)

## 2020-11-30 ENCOUNTER — Ambulatory Visit (INDEPENDENT_AMBULATORY_CARE_PROVIDER_SITE_OTHER): Payer: Medicare Other

## 2020-11-30 ENCOUNTER — Other Ambulatory Visit: Payer: Self-pay

## 2020-11-30 DIAGNOSIS — I441 Atrioventricular block, second degree: Secondary | ICD-10-CM

## 2020-11-30 LAB — CUP PACEART INCLINIC DEVICE CHECK
Battery Remaining Longevity: 132 mo
Battery Voltage: 3.18 V
Brady Statistic AP VP Percent: 10.72 %
Brady Statistic AP VS Percent: 11.55 %
Brady Statistic AS VP Percent: 5.66 %
Brady Statistic AS VS Percent: 72.07 %
Brady Statistic RA Percent Paced: 22.18 %
Brady Statistic RV Percent Paced: 16.38 %
Date Time Interrogation Session: 20220818163722
Implantable Lead Implant Date: 20220418
Implantable Lead Implant Date: 20220418
Implantable Lead Location: 753859
Implantable Lead Location: 753860
Implantable Lead Model: 5076
Implantable Lead Model: 5076
Implantable Pulse Generator Implant Date: 20220418
Lead Channel Impedance Value: 285 Ohm
Lead Channel Impedance Value: 323 Ohm
Lead Channel Impedance Value: 399 Ohm
Lead Channel Impedance Value: 475 Ohm
Lead Channel Pacing Threshold Amplitude: 0.75 V
Lead Channel Pacing Threshold Amplitude: 0.875 V
Lead Channel Pacing Threshold Pulse Width: 0.4 ms
Lead Channel Pacing Threshold Pulse Width: 0.4 ms
Lead Channel Sensing Intrinsic Amplitude: 1.75 mV
Lead Channel Sensing Intrinsic Amplitude: 1.75 mV
Lead Channel Sensing Intrinsic Amplitude: 12.125 mV
Lead Channel Sensing Intrinsic Amplitude: 12.125 mV
Lead Channel Setting Pacing Amplitude: 3.25 V
Lead Channel Setting Pacing Amplitude: 3.25 V
Lead Channel Setting Pacing Pulse Width: 0.4 ms
Lead Channel Setting Sensing Sensitivity: 0.9 mV

## 2020-11-30 NOTE — Progress Notes (Signed)
Patient in office for Pacemaker adjustment to turn on AF Alerts at 2 hour increments.  Pt seen in office by MD earlier this week, full check not completed today.  AF Alerts programmed on to alert for AF greater than 2 hours.

## 2021-01-29 ENCOUNTER — Ambulatory Visit (INDEPENDENT_AMBULATORY_CARE_PROVIDER_SITE_OTHER): Payer: Medicare Other

## 2021-01-29 DIAGNOSIS — I441 Atrioventricular block, second degree: Secondary | ICD-10-CM | POA: Diagnosis not present

## 2021-02-01 LAB — CUP PACEART REMOTE DEVICE CHECK
Battery Remaining Longevity: 164 mo
Battery Voltage: 3.17 V
Brady Statistic AP VP Percent: 10.26 %
Brady Statistic AP VS Percent: 15.32 %
Brady Statistic AS VP Percent: 13.18 %
Brady Statistic AS VS Percent: 61.24 %
Brady Statistic RA Percent Paced: 25.29 %
Brady Statistic RV Percent Paced: 23.44 %
Date Time Interrogation Session: 20221017015309
Implantable Lead Implant Date: 20220418
Implantable Lead Implant Date: 20220418
Implantable Lead Location: 753859
Implantable Lead Location: 753860
Implantable Lead Model: 5076
Implantable Lead Model: 5076
Implantable Pulse Generator Implant Date: 20220418
Lead Channel Impedance Value: 304 Ohm
Lead Channel Impedance Value: 323 Ohm
Lead Channel Impedance Value: 399 Ohm
Lead Channel Impedance Value: 570 Ohm
Lead Channel Pacing Threshold Amplitude: 0.625 V
Lead Channel Pacing Threshold Amplitude: 1 V
Lead Channel Pacing Threshold Pulse Width: 0.4 ms
Lead Channel Pacing Threshold Pulse Width: 0.4 ms
Lead Channel Sensing Intrinsic Amplitude: 1.875 mV
Lead Channel Sensing Intrinsic Amplitude: 1.875 mV
Lead Channel Sensing Intrinsic Amplitude: 14.875 mV
Lead Channel Sensing Intrinsic Amplitude: 14.875 mV
Lead Channel Setting Pacing Amplitude: 1.5 V
Lead Channel Setting Pacing Amplitude: 2 V
Lead Channel Setting Pacing Pulse Width: 0.4 ms
Lead Channel Setting Sensing Sensitivity: 0.9 mV

## 2021-02-07 NOTE — Progress Notes (Signed)
Remote pacemaker transmission.   

## 2021-05-21 ENCOUNTER — Other Ambulatory Visit: Payer: Self-pay

## 2021-05-21 ENCOUNTER — Ambulatory Visit (INDEPENDENT_AMBULATORY_CARE_PROVIDER_SITE_OTHER): Payer: Medicare Other | Admitting: Cardiovascular Disease

## 2021-05-21 VITALS — BP 128/70 | HR 66 | Wt 159.0 lb

## 2021-05-21 DIAGNOSIS — I441 Atrioventricular block, second degree: Secondary | ICD-10-CM | POA: Diagnosis not present

## 2021-05-21 DIAGNOSIS — E7800 Pure hypercholesterolemia, unspecified: Secondary | ICD-10-CM

## 2021-05-21 DIAGNOSIS — Z950 Presence of cardiac pacemaker: Secondary | ICD-10-CM

## 2021-05-21 DIAGNOSIS — I480 Paroxysmal atrial fibrillation: Secondary | ICD-10-CM | POA: Diagnosis not present

## 2021-05-21 DIAGNOSIS — K8500 Idiopathic acute pancreatitis without necrosis or infection: Secondary | ICD-10-CM

## 2021-05-21 LAB — PACEMAKER DEVICE OBSERVATION

## 2021-05-21 NOTE — Patient Instructions (Signed)
Medication Instructions:  ?No changes ?*If you need a refill on your cardiac medications before your next appointment, please call your pharmacy* ? ? ?Lab Work: ?None ordered ?If you have labs (blood work) drawn today and your tests are completely normal, you will receive your results only by: ?MyChart Message (if you have MyChart) OR ?A paper copy in the mail ?If you have any lab test that is abnormal or we need to change your treatment, we will call you to review the results. ? ? ?Testing/Procedures: ?None ordered ? ? ?Follow-Up: ?At [facility], you and your health needs are our priority.  As part of our continuing mission to provide you with exceptional heart care, we have created designated Provider Care Teams.  These Care Teams include your primary Cardiologist (physician) and Advanced Practice Providers (APPs -  Physician Assistants and Nurse Practitioners) who all work together to provide you with the care you need, when you need it. ? ?We recommend signing up for the patient portal called "MyChart".  Sign up information is provided on this After Visit Summary.  MyChart is used to connect with patients for Virtual Visits (Telemedicine).  Patients are able to view lab/test results, encounter notes, upcoming appointments, etc.  Non-urgent messages can be sent to your provider as well.   ?To learn more about what you can do with MyChart, go to [email or web address].   ? ?Your next appointment:   ?12 month(s) ? ?The format for your next appointment:   ?In Person ? ?Provider:   ?Mihai Croitoru, MD { ? ?I ?

## 2021-05-21 NOTE — Progress Notes (Signed)
Cardiology Office Note:    Date:  05/22/2021   ID:  Jacob Kirk, DOB Aug 19, 1924, MRN [id number]  PCP:  Tracey Harries, MD  Cardiologist:  Thurmon Fair, MD  Electrophysiologist:  None   Referring MD: Tracey Harries, MD   Chief complaint: presyncope; high grade AV block  History of Present Illness:    Jacob Kirk is a 88 y.o. male with a history of near syncope and second-degree AV block Mobitz type I, possible intermittent 3rd degree AV block, s/p idual chamber pacemaker ([employer] Azure April 2022)  He is routinely accompanied by his son [name].  He has not had any recent cardiovascular problems, but he has recurrent episodes of pancreatitis.  Work-up has demonstrated the presence of "biliary sludge".  He does not drink alcohol.  His dose of divalproex was doubled in the recent past.  Overall, his level of cognitive and physical functioning has deteriorated slightly since his last appointment.  Any syncopal events or falls since pacemaker implantation.  He has not had any focal neurological events.  He denies angina or dyspnea at rest or with activity, but is very sedentary.  He has not had lower extremity edema or claudication.  Denies palpitations.  Interrogation of his pacemaker shows infrequent episodes of brief atrial fibrillation, the longest episode being 16 minutes in duration on April 15, 2021.  More commonly he has brief episodes of paroxysmal atrial tachycardia with 1: 1 AV conduction at 154 bpm.  The overall burden of atrial arrhythmias less than 0.1%.  He has 25% atrial pacing and 18% ventricular pacing.  The anticipated generator longevity is 13.5 years.   He has no history of stroke or TIA (essentially embolic risk is defined only by his advanced age). He does not have HTN, DM, clinically evident CAD or PAD (but does have calcific atherosclerosis in the coronary distribution and the aorta on non-cardiac chest CTA) and he has normal LV function. CHADSVasc score is  2-3.  He was hospitalized in late December 2021 with pancreatitis of uncertain etiology (no identified gallstones, no alcohol consumption, no new medications, mesenteric ischemia felt to be an unlikely cause, etc.) from which he has recovered well.  Most recent episode of pancreatitis occurred in November 2022   In 2021 he had a an echocardiogram with normal findings and a nuclear stress test with a questionable inferolateral area of mixed reversible/fixed defect (in my opinion there was no evidence of ischemic abnormalities).  No evidence of pulmonary embolism by CT angiography, but there was heavy atherosclerotic calcification seen in the coronary arteries, particularly in the proximal-mid LAD and throughout the AV groove segment of the left circumflex coronary artery.  Subsequent event monitoring did not show any evidence of second-degree AV block or any other major arrhythmia.   Past Medical History:  Diagnosis Date   Bronchitis    Coronary artery disease    Spinal stenosis     Past Surgical History:  Procedure Laterality Date   APPENDECTOMY     JOINT REPLACEMENT     PACEMAKER IMPLANT N/A 07/31/2020   Procedure: PACEMAKER IMPLANT;  Surgeon: Thurmon Fair, MD;  Location: [facility];  Service: Cardiovascular;  Laterality: N/A;    Current Medications: Current Meds  Medication Sig   aspirin EC 81 MG EC tablet Take 1 tablet (81 mg total) by mouth daily. (Patient taking differently: Take 81 mg by mouth every evening.)   atorvastatin (LIPITOR) 40 MG tablet TAKE 1 TABLET (40 MG TOTAL) BY MOUTH DAILY AT 6  PM.   CANNABIDIOL PO Place 1 drop under the tongue in the morning. CBD Oil   divalproex (DEPAKOTE) 125 MG DR tablet Take 125 mg by mouth 2 (two) times daily.   furosemide (LASIX) 20 MG tablet Take 20 mg by mouth in the morning.   ibuprofen (ADVIL) 200 MG tablet Take 400 mg by mouth in the morning and at bedtime.   LUMIGAN 0.01 % SOLN Place 1 drop into both eyes at bedtime.    mirtazapine (REMERON) 7.5 MG tablet Take 7.5 mg by mouth at bedtime.   Polyethyl Glyc-Propyl Glyc PF (SYSTANE ULTRA PF) 0.4-0.3 % SOLN Place 1-2 drops into both eyes 3 (three) times daily as needed (dry/irritated eyes).   risperiDONE (RISPERDAL) 0.25 MG tablet Take 0.25 mg by mouth 2 (two) times daily.   sertraline (ZOLOFT) 50 MG tablet Take 50 mg by mouth in the morning.   traMADol (ULTRAM) 50 MG tablet Take 50 mg by mouth every 8 (eight) hours as needed. Patient takes 1 tablet twice daily.   White Petrolatum-Mineral Oil (SYSTANE NIGHTTIME) OINT Apply 1 application to eye at bedtime.   XIIDRA 5 % SOLN Place 1 drop into both eyes 2 (two) times daily.   Current Facility-Administered Medications for the 05/21/21 encounter (Office Visit) with Zandra Lajeunesse, Rachelle HoraMihai, MD  Medication   lidocaine-EPINEPHrine (XYLOCAINE W/EPI) 1 %-1:100000 (with pres) injection 10 mL   sodium chloride flush (NS) 0.9 % injection 3 mL     Allergies:   Patient has no known allergies.   Social History   Socioeconomic History   Marital status: Married    Spouse name: Not on file   Number of children: Not on file   Years of education: Not on file   Highest education level: Not on file  Occupational History   Not on file  Tobacco Use   Smoking status: Never   Smokeless tobacco: Never  Substance and Sexual Activity   Alcohol use: No   Drug use: No   Sexual activity: Not on file  Other Topics Concern   Not on file  Social History Narrative   Not on file   Social Determinants of Health   Financial Resource Strain: Not on file  Food Insecurity: Not on file  Transportation Needs: Not on file  Physical Activity: Not on file  Stress: Not on file  Social Connections: Not on file     Family History: The patient's family history is significantly negative for premature Cardiac/vascular disease or arrhythmia  ROS:   Please see the history of present illness.    All other systems are reviewed and are negative.    EKGs/Labs/Other Studies Reviewed:    The following studies were reviewed today: Comprehensive pacemaker check.  EKG: Ordered today and personally reviewed.  ECG shows transition from sinus rhythm with native AV conduction to atrial paced, ventricular sensed rhythm.  There is left axis deviation and left anterior fascicular block.  No ischemic repolarization abnormalities, QTc 446 ms  Recent Labs: 11/10/2020: BUN 29; Creatinine, Ser 1.10; Hemoglobin 11.8; Platelets 179; Potassium 4.6; Sodium 142  Recent Lipid Panel    Component Value Date/Time   CHOL 221 (H) 01/13/2019 0227   TRIG 96 01/13/2019 0227   HDL 39 (L) 01/13/2019 0227   CHOLHDL 5.7 01/13/2019 0227   VLDL 19 01/13/2019 0227   LDLCALC NOT CALCULATED 01/13/2019 0227    Physical Exam:    VS:  BP 128/70 (BP Location: Left Arm, Patient Position: Sitting, Cuff Size: Normal)  Pulse 66    Wt 159 lb (72.1 kg)    SpO2 96%    BMI 24.90 kg/m     Wt Readings from Last 3 Encounters:  05/21/21 159 lb (72.1 kg)  11/27/20 166 lb 3.2 oz (75.4 kg)  07/12/20 176 lb (79.8 kg)      General: Alert, oriented x3, no distress, appears elderly and frail.  Normal weight, but he has lost 17 pounds in the last year.  Well-healed left subclavian pacemaker site. Head: no evidence of trauma, PERRL, EOMI, no exophtalmos or lid lag, no myxedema, no xanthelasma; normal ears, nose and oropharynx Neck: normal jugular venous pulsations and no hepatojugular reflux; brisk carotid pulses without delay and no carotid bruits Chest: clear to auscultation, no signs of consolidation by percussion or palpation, normal fremitus, symmetrical and full respiratory excursions Cardiovascular: normal position and quality of the apical impulse, regular rhythm, normal first and second heart sounds, no murmurs, rubs or gallops Abdomen: no tenderness or distention, no masses by palpation, no abnormal pulsatility or arterial bruits, normal bowel sounds, no  hepatosplenomegaly Extremities: no clubbing, cyanosis or edema; 2+ radial, ulnar and brachial pulses bilaterally; 2+ right femoral, posterior tibial and dorsalis pedis pulses; 2+ left femoral, posterior tibial and dorsalis pedis pulses; no subclavian or femoral bruits Neurological: grossly nonfocal Psych: Normal mood and affect    ASSESSMENT:    1. Paroxysmal atrial fibrillation (HCC)   2. Second degree AV block   3. Pacemaker   4. Hypercholesterolemia   5. Idiopathic acute pancreatitis, unspecified complication status      PLAN:    In order of problems listed above:  AFib: The episodes of atrial fibrillation remain very brief and the overall prevalence is low.  The risks of anticoagulation in this elderly gentleman with a relatively low CHA2DS2-VASc risk score exceed the expected benefit.  I do not think we will change this approach unless he has evidence of embolic neurological events.  We will continue to monitor via his pacemaker.  The device was programmed to send an urgent notification for episodes of atrial fibrillation lasting 2 hours or longer. 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  He has not had syncope since pacemaker implantation. Pacemaker: Normal device function.  Continue remote downloads every 3 months. HLP: He has imaging evidence of aortic atherosclerosis and coronary atherosclerosis, but had a low risk nuclear stress test, has never had angina pectoris or clinical evidence of PAD.  Continue statin and aspirin.  Target LDL preferably less than 70, but avoid polypharmacy in this nonagenarian without ischemic events to date.   History of idiopathic pancreatitis: Recurrent events of uncertain etiology.  Note potential connection between use of valproic acid and pancreatitis.  She reviewed this with the prescribing physician.     Medication Adjustments/Labs and Tests  Ordered: Current medicines are reviewed at length with the patient today.  Concerns regarding medicines are outlined above.  Orders Placed This Encounter  Procedures   EKG 12-Lead    No orders of the defined types were placed in this encounter.    Patient Instructions  Medication Instructions:  No changes *If you need a refill on your cardiac medications before your next appointment, please call your pharmacy*   Lab Work: None ordered If you have labs (blood work) drawn today and your tests are completely normal, you will receive your results only by: MyChart Message (if you have MyChart) OR  A paper copy in the mail If you have any lab test that is abnormal or we need to change your treatment, we will call you to review the results.   Testing/Procedures: None ordered   Follow-Up: At [facility], you and your health needs are our priority.  As part of our continuing mission to provide you with exceptional heart care, we have created designated Provider Care Teams.  These Care Teams include your primary Cardiologist (physician) and Advanced Practice Providers (APPs -  Physician Assistants and Nurse Practitioners) who all work together to provide you with the care you need, when you need it.  We recommend signing up for the patient portal called "MyChart".  Sign up information is provided on this After Visit Summary.  MyChart is used to connect with patients for Virtual Visits (Telemedicine).  Patients are able to view lab/test results, encounter notes, upcoming appointments, etc.  Non-urgent messages can be sent to your provider as well.   To learn more about what you can do with MyChart, go to [email or web address].    Your next appointment:   12 month(s)  The format for your next appointment:   In Person  Provider:   Thurmon Fair, MD {  I   Signed, Thurmon Fair, MD  05/22/2021 5:13 PM    [facility]

## 2021-05-22 ENCOUNTER — Encounter: Payer: Self-pay | Admitting: Cardiovascular Disease

## 2021-07-30 ENCOUNTER — Ambulatory Visit (INDEPENDENT_AMBULATORY_CARE_PROVIDER_SITE_OTHER): Payer: Medicare Other

## 2021-07-30 DIAGNOSIS — I441 Atrioventricular block, second degree: Secondary | ICD-10-CM

## 2021-08-01 LAB — CUP PACEART REMOTE DEVICE CHECK
Battery Remaining Longevity: 158 mo
Battery Voltage: 3.08 V
Brady Statistic AP VP Percent: 6.75 %
Brady Statistic AP VS Percent: 21.97 %
Brady Statistic AS VP Percent: 7.11 %
Brady Statistic AS VS Percent: 64.17 %
Brady Statistic RA Percent Paced: 28.47 %
Brady Statistic RV Percent Paced: 13.87 %
Date Time Interrogation Session: 20230417015242
Implantable Lead Implant Date: 20220418
Implantable Lead Implant Date: 20220418
Implantable Lead Location: 753859
Implantable Lead Location: 753860
Implantable Lead Model: 5076
Implantable Lead Model: 5076
Implantable Pulse Generator Implant Date: 20220418
Lead Channel Impedance Value: 285 Ohm
Lead Channel Impedance Value: 323 Ohm
Lead Channel Impedance Value: 361 Ohm
Lead Channel Impedance Value: 437 Ohm
Lead Channel Pacing Threshold Amplitude: 0.75 V
Lead Channel Pacing Threshold Amplitude: 0.875 V
Lead Channel Pacing Threshold Pulse Width: 0.4 ms
Lead Channel Pacing Threshold Pulse Width: 0.4 ms
Lead Channel Sensing Intrinsic Amplitude: 13.625 mV
Lead Channel Sensing Intrinsic Amplitude: 13.625 mV
Lead Channel Sensing Intrinsic Amplitude: 2 mV
Lead Channel Sensing Intrinsic Amplitude: 2 mV
Lead Channel Setting Pacing Amplitude: 1.5 V
Lead Channel Setting Pacing Amplitude: 2 V
Lead Channel Setting Pacing Pulse Width: 0.4 ms
Lead Channel Setting Sensing Sensitivity: 0.9 mV

## 2021-08-17 NOTE — Progress Notes (Signed)
Remote pacemaker transmission.   

## 2021-08-21 ENCOUNTER — Other Ambulatory Visit: Payer: Self-pay | Admitting: Cardiovascular Disease

## 2021-09-02 ENCOUNTER — Emergency Department (HOSPITAL_COMMUNITY): Payer: Medicare Other

## 2021-09-02 ENCOUNTER — Emergency Department (HOSPITAL_COMMUNITY)
Admission: EM | Admit: 2021-09-02 | Discharge: 2021-09-02 | Disposition: A | Discharge status: Home / Self Care | Payer: Medicare Other | Attending: Emergency Medicine | Admitting: Emergency Medicine

## 2021-09-02 ENCOUNTER — Encounter (HOSPITAL_COMMUNITY): Payer: Self-pay

## 2021-09-02 DIAGNOSIS — Z7982 Long term (current) use of aspirin: Secondary | ICD-10-CM | POA: Insufficient documentation

## 2021-09-02 DIAGNOSIS — S022XXA Fracture of nasal bones, initial encounter for closed fracture: Secondary | ICD-10-CM | POA: Diagnosis not present

## 2021-09-02 DIAGNOSIS — Z950 Presence of cardiac pacemaker: Secondary | ICD-10-CM | POA: Insufficient documentation

## 2021-09-02 DIAGNOSIS — M545 Low back pain, unspecified: Secondary | ICD-10-CM | POA: Diagnosis not present

## 2021-09-02 DIAGNOSIS — M542 Cervicalgia: Secondary | ICD-10-CM | POA: Diagnosis not present

## 2021-09-02 DIAGNOSIS — I2510 Atherosclerotic heart disease of native coronary artery without angina pectoris: Secondary | ICD-10-CM | POA: Diagnosis not present

## 2021-09-02 DIAGNOSIS — M546 Pain in thoracic spine: Secondary | ICD-10-CM | POA: Insufficient documentation

## 2021-09-02 DIAGNOSIS — S0992XA Unspecified injury of nose, initial encounter: Secondary | ICD-10-CM | POA: Diagnosis present

## 2021-09-02 DIAGNOSIS — W01198A Fall on same level from slipping, tripping and stumbling with subsequent striking against other object, initial encounter: Secondary | ICD-10-CM | POA: Insufficient documentation

## 2021-09-02 DIAGNOSIS — W19XXXA Unspecified fall, initial encounter: Secondary | ICD-10-CM

## 2021-09-02 HISTORY — DX: Unspecified dementia, unspecified severity, without behavioral disturbance, psychotic disturbance, mood disturbance, and anxiety: F03.90

## 2021-09-02 MED ORDER — LIDOCAINE-EPINEPHRINE-TETRACAINE (LET) TOPICAL GEL
3.0000 mL | Freq: Once | TOPICAL | Status: AC
  Administered 2021-09-02: 3 mL via TOPICAL
  Filled 2021-09-02: qty 3

## 2021-09-02 NOTE — ED Provider Notes (Signed)
[facility] EMERGENCY DEPARTMENT Provider Note   CSN: [id number] Arrival date & time: 09/02/21  1008     History  Chief Complaint  Patient presents with   Jacob Kirk    Jacob Kirk is a 88 y.o. male.  Patient is a 88 year old male with a history of anemia, paroxysmal atrial fibrillation and heart block status post pacemaker not on anticoagulation, CAD and memory loss who is presenting today after a fall at his son's workshop.  Patient typically ambulates with a cane but reports they had just mopped the floor and he turned the corner and was wearing new shoes and they slipped causing him to fall forward hitting his face on the floor.  He denies any loss of consciousness but did have significant pain in his face and bleeding of his nose.  He also noticed a skin tear to his right forearm and has pain in bilateral hips, his back and his left ribs.  He feels like the pain in his ribs is from a chronic prior condition and denies any shortness of breath.  EMS reported they did have to suction his airway due to the nosebleed but he has been awake alert and oriented.  Patient also complains of some mild lower neck pain but denies any numbness or tingling in his arms or legs.  The history is provided by the patient, the EMS personnel and medical records.  Fall      Home Medications Prior to Admission medications   Medication Sig Start Date End Date Taking? Authorizing Provider  acetaminophen (TYLENOL) 500 MG tablet Take 500-1,000 mg by mouth every 6 (six) hours as needed (pain). Patient not taking: Reported on 05/21/2021    [provider]  albuterol (VENTOLIN HFA) 108 (90 Base) MCG/ACT inhaler Inhale 1-2 puffs into the lungs every 6 (six) hours as needed for wheezing or shortness of breath. Patient not taking: Reported on 05/21/2021    [provider]  aspirin EC 81 MG EC tablet Take 1 tablet (81 mg total) by mouth daily. Patient taking differently: Take 81 mg by  mouth every evening. 01/14/19   Debbe Odea, MD  atorvastatin (LIPITOR) 40 MG tablet TAKE 1 TABLET BY MOUTH DAILY AT 6 PM. 08/21/21   Croitoru, Mihai, MD  CANNABIDIOL PO Place 1 drop under the tongue in the morning. CBD Oil    [provider]  diphenhydrAMINE (BENADRYL) 25 MG tablet Take 25 mg by mouth at bedtime as needed for sleep. Patient not taking: Reported on 05/21/2021    [provider]  divalproex (DEPAKOTE) 125 MG DR tablet Take 125 mg by mouth 2 (two) times daily. 11/16/19   [provider]  furosemide (LASIX) 20 MG tablet Take 20 mg by mouth in the morning. 11/28/19   [provider]  ibuprofen (ADVIL) 200 MG tablet Take 400 mg by mouth in the morning and at bedtime.    [provider]  LUMIGAN 0.01 % SOLN Place 1 drop into both eyes at bedtime. 11/26/18   [provider]  mirtazapine (REMERON) 7.5 MG tablet Take 7.5 mg by mouth at bedtime. 07/12/20   [provider]  nitroGLYCERIN (NITROSTAT) 0.4 MG SL tablet Place 1 tablet (0.4 mg total) under the tongue every 5 (five) minutes as needed for chest pain. Patient not taking: Reported on 05/21/2021 01/13/19 01/13/20  Croitoru, Dani Gobble, MD  Polyethyl Glyc-Propyl Glyc PF (SYSTANE ULTRA PF) 0.4-0.3 % SOLN Place 1-2 drops into both eyes 3 (three) times daily as  needed (dry/irritated eyes).    [provider]  risperiDONE (RISPERDAL) 0.25 MG tablet Take 0.25 mg by mouth 2 (two) times daily. 07/11/20   [provider]  senna-docusate (SENOKOT-S) 8.6-50 MG tablet Take 1 tablet by mouth daily as needed. Patient not taking: Reported on 05/21/2021 04/16/20   [provider]  sertraline (ZOLOFT) 50 MG tablet Take 50 mg by mouth in the morning. 05/29/20   [provider]  traMADol (ULTRAM) 50 MG tablet Take 50 mg by mouth every 8 (eight) hours as needed. Patient takes 1 tablet twice daily. 11/13/20   [provider]  White Petrolatum-Mineral Oil (SYSTANE NIGHTTIME)  OINT Apply 1 application to eye at bedtime.    [provider]  XIIDRA 5 % SOLN Place 1 drop into both eyes 2 (two) times daily. 09/11/18   [provider]      Allergies    Patient has no known allergies.    Review of Systems   Review of Systems  Physical Exam Updated Vital Signs BP (!) 148/62 (BP Location: Right Arm)   Pulse 72   Temp (!) 97.5 F (36.4 C) (Oral)   Resp 16   Ht 5\' 7"  (1.702 m)   Wt 72.1 kg   SpO2 100%   BMI 24.90 kg/m  Physical Exam Vitals and nursing note reviewed.  Constitutional:      General: He is not in acute distress.    Appearance: He is well-developed.  HENT:     Head: Normocephalic.      Comments: Left epistaxis which is now controlled without septal hematoma    Mouth/Throat:     Comments: Minimal blood in the posterior pharynx no dental or oral trauma Eyes:     Conjunctiva/sclera: Conjunctivae normal.     Pupils: Pupils are equal, round, and reactive to light.  Neck:   Cardiovascular:     Rate and Rhythm: Normal rate and regular rhythm.     Heart sounds: No murmur heard. Pulmonary:     Effort: Pulmonary effort is normal. No respiratory distress.     Breath sounds: Normal breath sounds. No wheezing or rales.     Comments: Minimal tenderness with palpation along the left ribs Chest:     Chest wall: Tenderness present.  Abdominal:     General: There is no distension.     Palpations: Abdomen is soft.     Tenderness: There is no abdominal tenderness. There is no guarding or rebound.  Musculoskeletal:        General: No tenderness. Normal range of motion.     Cervical back: Normal range of motion and neck supple. Spinous process tenderness present. No muscular tenderness.     Thoracic back: Bony tenderness present.     Lumbar back: Bony tenderness present.       Back:     Comments: Able to bend knees bilaterally without significant pain.  Mild tenderness of the left medial ankle with palpation and range of motion  without significant swelling.  Able to range both hips but reports tenderness with both.  No severe pain with range of motion  Skin:    General: Skin is warm and dry.     Findings: No erythema or rash.  Neurological:     Mental Status: He is alert and oriented to person, place, and time. Mental status is at baseline.  Psychiatric:        Mood and Affect: Mood normal.  Behavior: Behavior normal.    ED Results / Procedures / Treatments   Labs (all labs ordered are listed, but only abnormal results are displayed) Labs Reviewed - No data to display  EKG None  Radiology DG Chest 1 View  Result Date: 09/02/2021 CLINICAL DATA:  Pain after fall EXAM: CHEST  1 VIEW COMPARISON:  February 26, 2021 FINDINGS: The heart size and mediastinal contours are within normal limits. Both lungs are clear. The visualized skeletal structures are unremarkable. IMPRESSION: No active disease. Electronically Signed   By: Dorise Bullion III M.D.   On: 09/02/2021 11:27   DG Thoracic Spine 2 View  Result Date: 09/02/2021 CLINICAL DATA:  Pain after fall EXAM: THORACIC SPINE 2 VIEWS COMPARISON:  None Available. FINDINGS: Curvature of the thoracic spine, apex to the right. Grade 1 anterolisthesis of T11 versus T12. No other malalignment. No acute fractures are identified on this study. IMPRESSION: No fracture or traumatic malalignment. Multilevel degenerative disc disease with small anterior osteophytes. Electronically Signed   By: Dorise Bullion III M.D.   On: 09/02/2021 11:26   DG Lumbar Spine Complete  Result Date: 09/02/2021 CLINICAL DATA:  Fall. EXAM: LUMBAR SPINE - COMPLETE 4+ VIEW COMPARISON:  None Available. FINDINGS: Curvature of the thoracolumbar spine, apex to the left. Grade 1 anterolisthesis of T11 versus T12. No other malalignment. Multilevel degenerative disc disease. Facet degenerative changes in the lumbar spine. No fractures are noted. Calcified atherosclerosis is seen in the abdominal aorta.  IMPRESSION: 1. No fracture or traumatic malalignment. Anterolisthesis, grade 1, of T11 versus T12. 2. Degenerative changes as above. Electronically Signed   By: Dorise Bullion III M.D.   On: 09/02/2021 11:25   DG Pelvis 1-2 Views  Result Date: 09/02/2021 CLINICAL DATA:  88 year old male with history of unwitnessed fall. EXAM: PELVIS - 1-2 VIEW COMPARISON:  No priors. FINDINGS: Single AP view of the pelvis demonstrates no definite acute displaced fractures of the bony pelvic ring. Status post right hip hemiarthroplasty. The prosthetic femoral head appears located in the acetabulum on this single view examination. Bilateral proximal femurs as visualized appear intact. Postoperative changes of ORIF are noted in the left femoral neck where there are 3 cannulated fixation screws. IMPRESSION: 1. No acute radiographic abnormality of the bony pelvis. 2. Postoperative changes, as above. Electronically Signed   By: Vinnie Langton M.D.   On: 09/02/2021 11:22   DG Ankle Complete Left  Result Date: 09/02/2021 CLINICAL DATA:  Pain after fall EXAM: LEFT ANKLE COMPLETE - 3+ VIEW COMPARISON:  None Available. FINDINGS: There is no evidence of fracture, dislocation, or joint effusion. There is no evidence of arthropathy or other focal bone abnormality. Soft tissues are unremarkable. IMPRESSION: Negative. Electronically Signed   By: Dorise Bullion III M.D.   On: 09/02/2021 11:29   CT Head Wo Contrast  Result Date: 09/02/2021 CLINICAL DATA:  Trauma EXAM: CT HEAD WITHOUT CONTRAST TECHNIQUE: Contiguous axial images were obtained from the base of the skull through the vertex without intravenous contrast. RADIATION DOSE REDUCTION: This exam was performed according to the departmental dose-optimization program which includes automated exposure control, adjustment of the mA and/or kV according to patient size and/or use of iterative reconstruction technique. COMPARISON:  11/28/2019 FINDINGS: Brain: No acute intracranial  findings are seen in noncontrast CT brain. There are no signs of bleeding within the cranium. Cortical sulci are prominent. There is decreased density in the periventricular and subcortical white matter. Vascular: Coarse arterial calcifications are seen. Skull: No fracture is seen  in the calvarium. Sinuses/Orbits: There is opacification of left maxillary sinus. There is mucosal thickening in the ethmoid sinus. Deformity is seen in the nasal bones. Other: None. IMPRESSION: No acute intracranial findings are seen in noncontrast CT brain. Atrophy. Small-vessel disease. Chronic sinusitis.  Possible nasal bone fractures are seen. Electronically Signed   By: Elmer Picker M.D.   On: 09/02/2021 11:38   CT Cervical Spine Wo Contrast  Result Date: 09/02/2021 CLINICAL DATA:  88 year old male status post unwitnessed fall. EXAM: CT CERVICAL SPINE WITHOUT CONTRAST TECHNIQUE: Multidetector CT imaging of the cervical spine was performed without intravenous contrast. Multiplanar CT image reconstructions were also generated. RADIATION DOSE REDUCTION: This exam was performed according to the departmental dose-optimization program which includes automated exposure control, adjustment of the mA and/or kV according to patient size and/or use of iterative reconstruction technique. COMPARISON:  None Available. Head and face CT today reported separately. Cervical spine CT 11/28/2019. FINDINGS: Alignment: Chronic straightening of cervical lordosis. Mild chronic degenerative anterolisthesis of C3 on C4 is stable. Cervicothoracic junction alignment is within normal limits. Bilateral posterior element alignment is within normal limits. Skull base and vertebrae: Skull base and vertebrae: Osteopenia. Visualized skull base is intact. No atlanto-occipital dissociation. C1 and C2 appear in lined, stable and intact. No acute osseous abnormality identified. Soft tissues and spinal canal: No prevertebral fluid or swelling. No visible canal  hematoma. Calcified carotid atherosclerosis, otherwise negative visible noncontrast neck soft tissues. Disc levels: Chronic C2-C3 posterior element ankylosis on the left. Widespread chronic cervical disc and endplate degeneration. Mild if any associated cervical spinal stenosis. Upper chest: Left chest cardiac pacemaker type leads. Negative lung apices. Grossly intact visible upper thoracic levels. Other: Negative visible noncontrast posterior fossa. IMPRESSION: Stable since 2021, no acute traumatic injury identified in the cervical spine. Electronically Signed   By: Genevie Ann M.D.   On: 09/02/2021 11:43   CT Maxillofacial Wo Contrast  Result Date: 09/02/2021 CLINICAL DATA:  88 year old male status post unwitnessed fall. EXAM: CT MAXILLOFACIAL WITHOUT CONTRAST TECHNIQUE: Multidetector CT imaging of the maxillofacial structures was performed. Multiplanar CT image reconstructions were also generated. RADIATION DOSE REDUCTION: This exam was performed according to the departmental dose-optimization program which includes automated exposure control, adjustment of the mA and/or kV according to patient size and/or use of iterative reconstruction technique. COMPARISON:  Head and cervical spine CT today. FINDINGS: Osseous: Mandible intact and normally located. Comminuted bilateral nasal bone fractures, mildly displaced with associated generalized nasal bridge soft tissue swelling and some soft tissue gas. Bilateral nasal process of the maxilla appear intact. No superimposed maxilla, zygoma, or pterygoid plate fracture. Central skull base appears intact. Orbits: Intact orbital walls. Postoperative changes to both globes but otherwise negative noncontrast orbits soft tissues. Sinuses: Chronic left maxillary sinus disease with mucoperiosteal thickening and opacified left OMC. No nasal septal hematoma is evident. Other paranasal sinuses, tympanic cavities, mastoids are well aerated. Soft tissues: Superficial soft tissue injury  at the bridge of the nose with posttraumatic soft tissue gas. No other superficial soft tissue injury identified. Calcified carotid atherosclerosis. Negative visible noncontrast deep soft tissue spaces of the face. Limited intracranial: Reported separately. IMPRESSION: 1. Comminuted and mildly displaced bilateral nasal bone fractures with associated nasal bridge soft tissue swelling and gas. 2. No other acute facial fracture identified. 3. Chronic left maxillary sinus disease. Electronically Signed   By: Genevie Ann M.D.   On: 09/02/2021 11:45    Procedures Procedures   LACERATION REPAIR Performed by: [employer] Authorized by:  Blanchie Dessert Consent: Verbal consent obtained. Risks and benefits: risks, benefits and alternatives were discussed Consent given by: patient Patient identity confirmed: provided demographic data Prepped and Draped in normal sterile fashion Wound explored  Laceration Location: Bridge of the nose  Laceration Length: 2 cm  No Foreign Bodies seen or palpated  Anesthesia: Topical Local anesthetic: LET Anesthetic total: 3 ml  Irrigation method: syringe Amount of cleaning: standard  Skin closure: 6.0 Prolene  Number of sutures: 4  Technique: Simple interrupted  Patient tolerance: Patient tolerated the procedure well with no immediate complications.   Medications Ordered in ED Medications  lidocaine-EPINEPHrine-tetracaine (LET) topical gel (3 mLs Topical Given 09/02/21 1042)    ED Course/ Medical Decision Making/ A&P                           Medical Decision Making Amount and/or Complexity of Data Reviewed Radiology: ordered and independent interpretation performed. Decision-making details documented in ED Course.  Risk OTC drugs.   Elderly gentleman who is presenting today with multiple medical problems and comorbidities and with a complaint that caries a high risk for morbidity and mortality.  Patient has facial trauma noted and concern for  facial fractures.  He is awake and oriented and takes no anticoagulation but also concern for possible intracranial injury.  Imaging of the neck and back also initiated due to palpable pain.  Wound on the face repaired as above.  Imaging pending.  No evidence of abdominal trauma.  Minimal tenderness over the left rib cage but oxygen saturation of 98% on room air.  I have independently visualized and interpreted pt's images today. No evidence of acute intracranial hemorrhage on head CT today or cervical fractures.  Facial CT does show comminuted and mildly displaced bilateral nasal bone fractures with associated nasal bridge soft tissue swelling and gas with chronic left maxillary sinusitis.  His left ankle is negative for fracture, radiology reports the lumbar and thoracic imaging is negative for acute fracture and chest x-ray without fractures.  On the findings results were discussed with the patient and his son who was present at bedside.  Patient does a pain medication regime at home and is well-appearing at this time.  He was given return precautions but does not meet admission criteria.  He was discharged home in good condition with his son.         Final Clinical Impression(s) / ED Diagnoses Final diagnoses:  Fall, initial encounter  Closed fracture of nasal bone, initial encounter    Rx / DC Orders ED Discharge Orders     None         Blanchie Dessert, MD 09/02/21 1320

## 2021-09-02 NOTE — ED Triage Notes (Signed)
Pt arrived via [facility]. Pt was walking around at son's job and had an unwitnessed mechanical fall. Pt arrived in a c-collar. Pt has hx dementia. Pt not on blood thinners per EMS. Per EMS, pt's nose was bleeding, lac on top of nose and small lac on tongue. Pt c/o left rib pain, right hip pain, mid back pain. Pt c/o tenderness upon palpation of back. Per EMS, pt mentation at baseline. VSS. Pt has an abrasion on forehead.

## 2021-09-02 NOTE — Discharge Instructions (Addendum)
Use saline spray in the nose 3-4 times a day to help with congestion and you can put vasoline inside the nose to help it from drying out.  Avoid aggressive nose blowing.  Stitches need to come out in about 5 days.  Continue his current pain regime but you can also use voltaren gel as needed for added pain relief.  If he develops vomiting, confusion or new symptoms return to the ER.

## 2021-09-02 NOTE — ED Notes (Signed)
Reviewed discharge instructions with patient and son. Follow-up care and pain management reviewed. Patient and son verbalized understanding. Patient A&Ox4, VSS upon discharge. Pt wheeled out to son's car.Jacob Kirk

## 2021-09-02 NOTE — ED Notes (Signed)
Pt transported to imaging at this time

## 2021-09-11 ENCOUNTER — Encounter (INDEPENDENT_AMBULATORY_CARE_PROVIDER_SITE_OTHER): Payer: Medicare Other | Admitting: Ophthalmology

## 2021-09-18 ENCOUNTER — Ambulatory Visit (INDEPENDENT_AMBULATORY_CARE_PROVIDER_SITE_OTHER): Payer: Medicare Other | Admitting: Ophthalmology

## 2021-09-18 ENCOUNTER — Encounter (INDEPENDENT_AMBULATORY_CARE_PROVIDER_SITE_OTHER): Payer: Self-pay | Admitting: Ophthalmology

## 2021-09-18 DIAGNOSIS — H353134 Nonexudative age-related macular degeneration, bilateral, advanced atrophic with subfoveal involvement: Secondary | ICD-10-CM

## 2021-09-18 DIAGNOSIS — H353133 Nonexudative age-related macular degeneration, bilateral, advanced atrophic without subfoveal involvement: Secondary | ICD-10-CM | POA: Diagnosis not present

## 2021-09-18 NOTE — Assessment & Plan Note (Signed)

## 2021-09-18 NOTE — Progress Notes (Signed)
09/18/2021     CHIEF COMPLAINT Patient presents for  Chief Complaint  Patient presents with   Macular Degeneration      HISTORY OF PRESENT ILLNESS: Jacob Kirk is a 88 y.o. male who presents to the clinic today for:   HPI   1 yr fu OU FP. Patient states vision is stable and unchanged since last visit. Denies any new floaters or FOL. Patient uses Xiidra BID OU, Lumigan discontinued around 04/2021. Patient was last seen by Dr. Manning Charity around January 2023. Last edited by Nelva Nay on 09/18/2021 10:32 AM.      Referring physician: Manning Charity, OD [address] [place],  [place] [address]  HISTORICAL INFORMATION:   Selected notes from the MEDICAL RECORD NUMBER       CURRENT MEDICATIONS: Current Outpatient Medications (Ophthalmic Drugs)  Medication Sig   LUMIGAN 0.01 % SOLN Place 1 drop into both eyes at bedtime.   Polyethyl Glyc-Propyl Glyc PF (SYSTANE ULTRA PF) 0.4-0.3 % SOLN Place 1-2 drops into both eyes 3 (three) times daily as needed (dry/irritated eyes).   White Petrolatum-Mineral Oil (SYSTANE NIGHTTIME) OINT Apply 1 application to eye at bedtime.   XIIDRA 5 % SOLN Place 1 drop into both eyes 2 (two) times daily.   No current facility-administered medications for this visit. (Ophthalmic Drugs)   Current Outpatient Medications (Other)  Medication Sig   acetaminophen (TYLENOL) 500 MG tablet Take 500-1,000 mg by mouth every 6 (six) hours as needed (pain). (Patient not taking: Reported on 05/21/2021)   albuterol (VENTOLIN HFA) 108 (90 Base) MCG/ACT inhaler Inhale 1-2 puffs into the lungs every 6 (six) hours as needed for wheezing or shortness of breath. (Patient not taking: Reported on 05/21/2021)   aspirin EC 81 MG EC tablet Take 1 tablet (81 mg total) by mouth daily. (Patient taking differently: Take 81 mg by mouth every evening.)   atorvastatin (LIPITOR) 40 MG tablet TAKE 1 TABLET BY MOUTH DAILY AT 6 PM.   CANNABIDIOL PO Place 1 drop under the tongue in the  morning. CBD Oil   diphenhydrAMINE (BENADRYL) 25 MG tablet Take 25 mg by mouth at bedtime as needed for sleep. (Patient not taking: Reported on 05/21/2021)   divalproex (DEPAKOTE) 125 MG DR tablet Take 125 mg by mouth 2 (two) times daily.   furosemide (LASIX) 20 MG tablet Take 20 mg by mouth in the morning.   ibuprofen (ADVIL) 200 MG tablet Take 400 mg by mouth in the morning and at bedtime.   mirtazapine (REMERON) 7.5 MG tablet Take 7.5 mg by mouth at bedtime.   nitroGLYCERIN (NITROSTAT) 0.4 MG SL tablet Place 1 tablet (0.4 mg total) under the tongue every 5 (five) minutes as needed for chest pain. (Patient not taking: Reported on 05/21/2021)   risperiDONE (RISPERDAL) 0.25 MG tablet Take 0.25 mg by mouth 2 (two) times daily.   senna-docusate (SENOKOT-S) 8.6-50 MG tablet Take 1 tablet by mouth daily as needed. (Patient not taking: Reported on 05/21/2021)   sertraline (ZOLOFT) 50 MG tablet Take 50 mg by mouth in the morning.   traMADol (ULTRAM) 50 MG tablet Take 50 mg by mouth every 8 (eight) hours as needed. Patient takes 1 tablet twice daily.   Current Facility-Administered Medications (Other)  Medication Route   lidocaine-EPINEPHrine (XYLOCAINE W/EPI) 1 %-1:100000 (with pres) injection 10 mL Infiltration   sodium chloride flush (NS) 0.9 % injection 3 mL Intravenous      REVIEW OF SYSTEMS: ROS   Negative for:  Constitutional, Gastrointestinal, Neurological, Skin, Genitourinary, Musculoskeletal, HENT, Endocrine, Cardiovascular, Eyes, Respiratory, Psychiatric, Allergic/Imm, Heme/Lymph Last edited by Edmon Crape, MD on 09/18/2021 11:30 AM.       ALLERGIES No Known Allergies  PAST MEDICAL HISTORY Past Medical History:  Diagnosis Date   Bronchitis    Coronary artery disease    Dementia (HCC)    Spinal stenosis    Past Surgical History:  Procedure Laterality Date   APPENDECTOMY     JOINT REPLACEMENT     PACEMAKER IMPLANT N/A 07/31/2020   Procedure: PACEMAKER IMPLANT;  Surgeon:  Thurmon Fair, MD;  Location: [facility];  Service: Cardiovascular;  Laterality: N/A;    FAMILY HISTORY History reviewed. No pertinent family history.  SOCIAL HISTORY Social History   Tobacco Use   Smoking status: Never   Smokeless tobacco: Never  Substance Use Topics   Alcohol use: No   Drug use: No         OPHTHALMIC EXAM:  Base Eye Exam     Visual Acuity (ETDRS)       Right Left   Dist [place] CF at 3' CF at 3'   Dist ph [place] NI NI         Tonometry (Tonopen, 10:34 AM)       Right Left   Pressure 22 15         Pupils       Dark Light Shape React APD   Right   Irregular  None   Left 4 3 Round Minimal None         Visual Fields (Counting fingers)       Left Right    Full Full         Extraocular Movement       Right Left    Full Full         Neuro/Psych     Oriented x3: Yes   Mood/Affect: Normal         Dilation     Both eyes: 1.0% Mydriacyl, 2.5% Phenylephrine @ 10:34 AM           Slit Lamp and Fundus Exam     External Exam       Right Left   External Normal Normal         Slit Lamp Exam       Right Left   Lids/Lashes Normal Normal   Conjunctiva/Sclera White and quiet White and quiet   Cornea Clear Clear   Anterior Chamber Deep and quiet Deep and quiet   Iris Round and reactive Round and reactive   Lens Posterior chamber intraocular lens Posterior chamber intraocular lens   Anterior Vitreous Normal Normal         Fundus Exam       Right Left   Posterior Vitreous Posterior vitreous detachment Posterior vitreous detachment   Disc Peripapillary atrophy Peripapillary atrophy   C/D Ratio 0.6 0.5   Macula Disciform scar, Atrophy, Geographic atrophy approximately 9 disc areas and some Disciform scar, Atrophy, Geographic atrophy approximately 15-20 disc areas in size   Vessels Normal Normal   Periphery Normal Normal            IMAGING AND PROCEDURES  Imaging and Procedures for 09/18/21  Color  Fundus Photography Optos - OU - Both Eyes       Right Eye Progression has been stable. Disc findings include normal observations. Macula : geographic atrophy. Vessels : normal observations. Periphery : normal observations.  Left Eye Progression has been stable. Macula : geographic atrophy. Vessels : normal observations. Periphery : normal observations.              ASSESSMENT/PLAN:  Advanced nonexudative age-related macular degeneration of both eyes with subfoveal involvement The nature of dry age related macular degeneration was discussed with the patient as well as its possible conversion to wet. The results of the AREDS 2 study was discussed with the patient. A diet rich in dark leafy green vegetables was advised and specific recommendations were made regarding supplements with AREDS 2 formulation . Control of hypertension and serum cholesterol may slow the disease. Smoking cessation is mandatory to slow the disease and diminish the risk of progressing to wet age related macular degeneration. The patient was instructed in the use of an Amsler Grid and was told to return immediately for any changes in the Grid. Stressed to the patient do not rub eyes     ICD-10-CM   1. Advanced nonexudative age-related macular degeneration of both eyes without subfoveal involvement  H35.3133 Color Fundus Photography Optos - OU - Both Eyes    2. Advanced nonexudative age-related macular degeneration of both eyes with subfoveal involvement  H35.3134 Color Fundus Photography Optos - OU - Both Eyes      1.  Geographic atrophy in the macular region of each eye.  No progression year-over-year.  We will continue to monitor and observe.  2.  Lateral also fovea and most of the macular region, not a great candidate for Syfovre  3.  Ophthalmic Meds Ordered this visit:  No orders of the defined types were placed in this encounter.      Return in about 1 year (around 09/19/2022) for COLOR FP, DILATE OU,  OCT.  There are no Patient Instructions on file for this visit.   Explained the diagnoses, plan, and follow up with the patient and they expressed understanding.  Patient expressed understanding of the importance of proper follow up care.   Alford HighlandGary A. Amalia Edgecombe M.D. Diseases & Surgery of the Retina and Vitreous [facility] 09/18/21     Abbreviations: M myopia (nearsighted); A astigmatism; H hyperopia (farsighted); P presbyopia; Mrx spectacle prescription;  CTL contact lenses; OD right eye; OS left eye; OU both eyes  XT exotropia; ET esotropia; PEK punctate epithelial keratitis; PEE punctate epithelial erosions; DES dry eye syndrome; MGD meibomian gland dysfunction; ATs artificial tears; PFAT's preservative free artificial tears; NSC nuclear sclerotic cataract; PSC posterior subcapsular cataract; ERM epi-retinal membrane; PVD posterior vitreous detachment; RD retinal detachment; DM diabetes mellitus; DR diabetic retinopathy; NPDR non-proliferative diabetic retinopathy; PDR proliferative diabetic retinopathy; CSME clinically significant macular edema; DME diabetic macular edema; dbh dot blot hemorrhages; CWS cotton wool spot; POAG primary open angle glaucoma; C/D cup-to-disc ratio; HVF humphrey visual field; GVF goldmann visual field; OCT optical coherence tomography; IOP intraocular pressure; BRVO Branch retinal vein occlusion; CRVO central retinal vein occlusion; CRAO central retinal artery occlusion; BRAO branch retinal artery occlusion; RT retinal tear; SB scleral buckle; PPV pars plana vitrectomy; VH Vitreous hemorrhage; PRP panretinal laser photocoagulation; IVK intravitreal kenalog; VMT vitreomacular traction; MH Macular hole;  NVD neovascularization of the disc; NVE neovascularization elsewhere; AREDS age related eye disease study; ARMD age related macular degeneration; POAG primary open angle glaucoma; EBMD epithelial/anterior basement membrane dystrophy; ACIOL anterior chamber  intraocular lens; IOL intraocular lens; PCIOL posterior chamber intraocular lens; Phaco/IOL phacoemulsification with intraocular lens placement; PRK photorefractive keratectomy; LASIK laser assisted in situ keratomileusis; HTN hypertension; DM  diabetes mellitus; COPD chronic obstructive pulmonary disease

## 2021-10-29 ENCOUNTER — Ambulatory Visit (INDEPENDENT_AMBULATORY_CARE_PROVIDER_SITE_OTHER): Payer: Medicare Other

## 2021-10-29 DIAGNOSIS — I441 Atrioventricular block, second degree: Secondary | ICD-10-CM | POA: Diagnosis not present

## 2021-10-31 LAB — CUP PACEART REMOTE DEVICE CHECK
Battery Remaining Longevity: 155 mo
Battery Voltage: 3.06 V
Brady Statistic AP VP Percent: 6.93 %
Brady Statistic AP VS Percent: 9.37 %
Brady Statistic AS VP Percent: 12.68 %
Brady Statistic AS VS Percent: 71.02 %
Brady Statistic RA Percent Paced: 16.07 %
Brady Statistic RV Percent Paced: 19.62 %
Date Time Interrogation Session: 20230717015431
Implantable Lead Implant Date: 20220418
Implantable Lead Implant Date: 20220418
Implantable Lead Location: 753859
Implantable Lead Location: 753860
Implantable Lead Model: 5076
Implantable Lead Model: 5076
Implantable Pulse Generator Implant Date: 20220418
Lead Channel Impedance Value: 285 Ohm
Lead Channel Impedance Value: 285 Ohm
Lead Channel Impedance Value: 342 Ohm
Lead Channel Impedance Value: 342 Ohm
Lead Channel Pacing Threshold Amplitude: 0.75 V
Lead Channel Pacing Threshold Amplitude: 0.75 V
Lead Channel Pacing Threshold Pulse Width: 0.4 ms
Lead Channel Pacing Threshold Pulse Width: 0.4 ms
Lead Channel Sensing Intrinsic Amplitude: 11.875 mV
Lead Channel Sensing Intrinsic Amplitude: 11.875 mV
Lead Channel Sensing Intrinsic Amplitude: 2.625 mV
Lead Channel Sensing Intrinsic Amplitude: 2.625 mV
Lead Channel Setting Pacing Amplitude: 1.5 V
Lead Channel Setting Pacing Amplitude: 2 V
Lead Channel Setting Pacing Pulse Width: 0.4 ms
Lead Channel Setting Sensing Sensitivity: 0.9 mV

## 2021-11-13 ENCOUNTER — Encounter: Payer: Self-pay | Admitting: Cardiovascular Disease

## 2021-11-26 NOTE — Progress Notes (Signed)
Remote pacemaker transmission.   

## 2022-01-28 ENCOUNTER — Ambulatory Visit (INDEPENDENT_AMBULATORY_CARE_PROVIDER_SITE_OTHER): Payer: Medicare Other

## 2022-01-28 DIAGNOSIS — I441 Atrioventricular block, second degree: Secondary | ICD-10-CM | POA: Diagnosis not present

## 2022-01-29 LAB — CUP PACEART REMOTE DEVICE CHECK
Battery Remaining Longevity: 150 mo
Battery Voltage: 3.04 V
Brady Statistic AP VP Percent: 17.4 %
Brady Statistic AP VS Percent: 7.59 %
Brady Statistic AS VP Percent: 29.21 %
Brady Statistic AS VS Percent: 45.81 %
Brady Statistic RA Percent Paced: 24.61 %
Brady Statistic RV Percent Paced: 46.61 %
Date Time Interrogation Session: 20231016015538
Implantable Lead Implant Date: 20220418
Implantable Lead Implant Date: 20220418
Implantable Lead Location: 753859
Implantable Lead Location: 753860
Implantable Lead Model: 5076
Implantable Lead Model: 5076
Implantable Pulse Generator Implant Date: 20220418
Lead Channel Impedance Value: 285 Ohm
Lead Channel Impedance Value: 285 Ohm
Lead Channel Impedance Value: 342 Ohm
Lead Channel Impedance Value: 342 Ohm
Lead Channel Pacing Threshold Amplitude: 0.625 V
Lead Channel Pacing Threshold Amplitude: 0.75 V
Lead Channel Pacing Threshold Pulse Width: 0.4 ms
Lead Channel Pacing Threshold Pulse Width: 0.4 ms
Lead Channel Sensing Intrinsic Amplitude: 13.125 mV
Lead Channel Sensing Intrinsic Amplitude: 13.125 mV
Lead Channel Sensing Intrinsic Amplitude: 2.375 mV
Lead Channel Sensing Intrinsic Amplitude: 2.375 mV
Lead Channel Setting Pacing Amplitude: 1.5 V
Lead Channel Setting Pacing Amplitude: 2 V
Lead Channel Setting Pacing Pulse Width: 0.4 ms
Lead Channel Setting Sensing Sensitivity: 0.9 mV

## 2022-02-21 NOTE — Progress Notes (Signed)
Remote pacemaker transmission.   

## 2022-04-29 ENCOUNTER — Ambulatory Visit (INDEPENDENT_AMBULATORY_CARE_PROVIDER_SITE_OTHER): Payer: Medicare Other

## 2022-04-29 DIAGNOSIS — I441 Atrioventricular block, second degree: Secondary | ICD-10-CM

## 2022-04-30 LAB — CUP PACEART REMOTE DEVICE CHECK
Battery Remaining Longevity: 146 mo
Battery Voltage: 3.03 V
Brady Statistic AP VP Percent: 11.45 %
Brady Statistic AP VS Percent: 7.82 %
Brady Statistic AS VP Percent: 34.11 %
Brady Statistic AS VS Percent: 46.62 %
Brady Statistic RA Percent Paced: 18.84 %
Brady Statistic RV Percent Paced: 45.56 %
Date Time Interrogation Session: 20240115005518
Implantable Lead Connection Status: 753985
Implantable Lead Connection Status: 753985
Implantable Lead Implant Date: 20220418
Implantable Lead Implant Date: 20220418
Implantable Lead Location: 753859
Implantable Lead Location: 753860
Implantable Lead Model: 5076
Implantable Lead Model: 5076
Implantable Pulse Generator Implant Date: 20220418
Lead Channel Impedance Value: 304 Ohm
Lead Channel Impedance Value: 304 Ohm
Lead Channel Impedance Value: 342 Ohm
Lead Channel Impedance Value: 342 Ohm
Lead Channel Pacing Threshold Amplitude: 0.75 V
Lead Channel Pacing Threshold Amplitude: 0.75 V
Lead Channel Pacing Threshold Pulse Width: 0.4 ms
Lead Channel Pacing Threshold Pulse Width: 0.4 ms
Lead Channel Sensing Intrinsic Amplitude: 13.5 mV
Lead Channel Sensing Intrinsic Amplitude: 13.5 mV
Lead Channel Sensing Intrinsic Amplitude: 2.375 mV
Lead Channel Sensing Intrinsic Amplitude: 2.375 mV
Lead Channel Setting Pacing Amplitude: 1.5 V
Lead Channel Setting Pacing Amplitude: 2 V
Lead Channel Setting Pacing Pulse Width: 0.4 ms
Lead Channel Setting Sensing Sensitivity: 0.9 mV
Zone Setting Status: 755011

## 2022-06-19 NOTE — Progress Notes (Signed)
Remote pacemaker transmission.   

## 2022-07-29 ENCOUNTER — Ambulatory Visit (INDEPENDENT_AMBULATORY_CARE_PROVIDER_SITE_OTHER): Payer: Medicare Other

## 2022-07-29 DIAGNOSIS — I441 Atrioventricular block, second degree: Secondary | ICD-10-CM

## 2022-07-29 LAB — CUP PACEART REMOTE DEVICE CHECK
Battery Remaining Longevity: 141 mo
Battery Voltage: 3.03 V
Brady Statistic AP VP Percent: 11.87 %
Brady Statistic AP VS Percent: 8.26 %
Brady Statistic AS VP Percent: 38.02 %
Brady Statistic AS VS Percent: 41.85 %
Brady Statistic RA Percent Paced: 19.56 %
Brady Statistic RV Percent Paced: 49.89 %
Date Time Interrogation Session: 20240415015701
Implantable Lead Connection Status: 753985
Implantable Lead Connection Status: 753985
Implantable Lead Implant Date: 20220418
Implantable Lead Implant Date: 20220418
Implantable Lead Location: 753859
Implantable Lead Location: 753860
Implantable Lead Model: 5076
Implantable Lead Model: 5076
Implantable Pulse Generator Implant Date: 20220418
Lead Channel Impedance Value: 285 Ohm
Lead Channel Impedance Value: 285 Ohm
Lead Channel Impedance Value: 323 Ohm
Lead Channel Impedance Value: 323 Ohm
Lead Channel Pacing Threshold Amplitude: 0.75 V
Lead Channel Pacing Threshold Amplitude: 0.875 V
Lead Channel Pacing Threshold Pulse Width: 0.4 ms
Lead Channel Pacing Threshold Pulse Width: 0.4 ms
Lead Channel Sensing Intrinsic Amplitude: 13.5 mV
Lead Channel Sensing Intrinsic Amplitude: 13.5 mV
Lead Channel Sensing Intrinsic Amplitude: 2.75 mV
Lead Channel Sensing Intrinsic Amplitude: 2.75 mV
Lead Channel Setting Pacing Amplitude: 1.5 V
Lead Channel Setting Pacing Amplitude: 2 V
Lead Channel Setting Pacing Pulse Width: 0.4 ms
Lead Channel Setting Sensing Sensitivity: 0.9 mV
Zone Setting Status: 755011

## 2022-08-11 ENCOUNTER — Other Ambulatory Visit: Payer: Self-pay | Admitting: Cardiovascular Disease

## 2022-08-15 ENCOUNTER — Other Ambulatory Visit: Payer: Self-pay

## 2022-08-15 ENCOUNTER — Encounter: Payer: Self-pay | Admitting: Cardiovascular Disease

## 2022-08-15 ENCOUNTER — Ambulatory Visit: Payer: Medicare Other | Attending: Cardiovascular Disease | Admitting: Cardiovascular Disease

## 2022-08-15 VITALS — BP 104/60 | HR 75 | Ht 63.0 in | Wt 164.6 lb

## 2022-08-15 DIAGNOSIS — I441 Atrioventricular block, second degree: Secondary | ICD-10-CM | POA: Diagnosis not present

## 2022-08-15 DIAGNOSIS — K8500 Idiopathic acute pancreatitis without necrosis or infection: Secondary | ICD-10-CM

## 2022-08-15 DIAGNOSIS — F03B11 Unspecified dementia, moderate, with agitation: Secondary | ICD-10-CM

## 2022-08-15 DIAGNOSIS — I480 Paroxysmal atrial fibrillation: Secondary | ICD-10-CM

## 2022-08-15 DIAGNOSIS — Z950 Presence of cardiac pacemaker: Secondary | ICD-10-CM

## 2022-08-15 DIAGNOSIS — E7800 Pure hypercholesterolemia, unspecified: Secondary | ICD-10-CM

## 2022-08-15 DIAGNOSIS — I700 Atherosclerosis of aorta: Secondary | ICD-10-CM

## 2022-08-15 DIAGNOSIS — G8929 Other chronic pain: Secondary | ICD-10-CM

## 2022-08-15 DIAGNOSIS — Z515 Encounter for palliative care: Secondary | ICD-10-CM

## 2022-08-15 NOTE — Patient Instructions (Signed)
Medication Instructions:  No changes *If you need a refill on your cardiac medications before your next appointment, please call your pharmacy*  Follow-Up: At [facility], you and your health needs are our priority.  As part of our continuing mission to provide you with exceptional heart care, we have created designated Provider Care Teams.  These Care Teams include your primary Cardiologist (physician) and Advanced Practice Providers (APPs -  Physician Assistants and Nurse Practitioners) who all work together to provide you with the care you need, when you need it.  We recommend signing up for the patient portal called "MyChart".  Sign up information is provided on this After Visit Summary.  MyChart is used to connect with patients for Virtual Visits (Telemedicine).  Patients are able to view lab/test results, encounter notes, upcoming appointments, etc.  Non-urgent messages can be sent to your provider as well.   To learn more about what you can do with MyChart, go to [email or web address].    Your next appointment:   1 year(s)  Provider:   Mihai Croitoru, MD     

## 2022-08-15 NOTE — Progress Notes (Signed)
Cardiology Office Note:    Date:  08/15/2022   ID:  Jacob Kirk, DOB 02-08-1925, MRN [id number]  PCP:  Tracey Harries, MD  Cardiologist:  Thurmon Fair, MD  Electrophysiologist:  None   Referring MD: Tracey Harries, MD   Chief complaint: presyncope; high grade AV block  History of Present Illness:    Jacob Kirk is a 89 y.o. male with a history of near syncope and second-degree AV block Mobitz type I, possible intermittent 3rd degree AV block, s/p idual chamber pacemaker (Medtronic Azure April 2022) also with history of dementia, lumbosacral spinal stenosis, chronic pain, dyslipidemia, recurrent pancreatitis.  He is routinely accompanied by his son Jacob Kirk.  Scotty continues to slowly deteriorate both from a physical and a mental point of view.  Cognition has deteriorated.  He was hospitalized in December since he purposely decrease his intake of fluids to avoid urinary frequency from his bladder outlet obstruction problems.  He cannot stop taking the furosemide.  He has edema even while taking the furosemide and using compression stockings.  He has had 2 falls in the last few months.  Most recently on 05/20/2022 he was seen in the emergency room and there was evidence of orthostatic hypotension.  He was hospitalized in December after a fall, syncope that lasted for about 2 minutes and associated with hypotension.  Lipase was minimally elevated.  CT of the chest showed a small nonocclusive pulmonary embolism (incidental note of atherosclerosis involving the aorta and coronary arteries and multiple abdominal branches) and no evidence of right heart strain, BNP borderline elevated at 123, troponin normal.  Thankfully neither fall was associated with head impact but he did lacerate the skin on his left hand.  He has not had any chest pain at rest.  The tramadol is really not working well for pain relief anymore.  He has not had any problems with shortness of breath at rest denies orthopnea or PND.   Gets dizzy if he stands up too quickly.  No other focal neurological complaints.  Memory is clearly getting worse.  Pacemaker interrogation shows normal device function.  He has 21% atrial pacing and 37% ventricular pacing.  He is at a very low burden of atrial fibrillation for a total of less than 2 hours in the last 15 months (less than 0.1%) with most episodes lasting for just a few minutes.  He did have 11-second episode of sustained ventricular tachycardia at about 195 bpm.  Unclear if this was associated with symptoms.  It happened around 2:00 in the afternoon when he was probably awake, but his son was not with him at the time of that event.  He is only had 1 other episode of nonsustained VT that was much shorter after 6 beats.  Interrogation of his pacemaker shows infrequent episodes of brief atrial fibrillation, the longest episode being 16 minutes in duration on April 15, 2021.  More commonly he has brief episodes of paroxysmal atrial tachycardia with 1: 1 AV conduction at 154 bpm.  The overall burden of atrial arrhythmias less than 0.1%.  He has 25% atrial pacing and 18% ventricular pacing.  The anticipated generator longevity is 13.5 years.   He has no history of stroke or TIA (essentially embolic risk is defined only by his advanced age). He does not have HTN, DM, clinically evident CAD or PAD (but does have calcific atherosclerosis in the coronary distribution and the aorta on non-cardiac chest CTA) and he has normal LV function. CHADSVasc score is 2-3.  He was hospitalized in late December 2021 with pancreatitis of uncertain etiology (no identified gallstones, no alcohol consumption, no new medications, mesenteric ischemia felt to be an unlikely cause, etc.) from which he has recovered well.  Most recent episode of pancreatitis occurred in November 2022.  Note that lipase was borderline elevated during his most recent hospital evaluation in December 2023 as well.   In 2021 he had a an  echocardiogram with normal findings and a nuclear stress test with a questionable inferolateral area of mixed reversible/fixed defect (in my opinion there was no evidence of ischemic abnormalities).  No evidence of pulmonary embolism by CT angiography, but there was heavy atherosclerotic calcification seen in the coronary arteries, particularly in the proximal-mid LAD and throughout the AV groove segment of the left circumflex coronary artery.  Subsequent event monitoring did not show any evidence of second-degree AV block or any other major arrhythmia.   Past Medical History:  Diagnosis Date   Bronchitis    Coronary artery disease    Dementia (HCC)    Spinal stenosis     Past Surgical History:  Procedure Laterality Date   APPENDECTOMY     JOINT REPLACEMENT     PACEMAKER IMPLANT N/A 07/31/2020   Procedure: PACEMAKER IMPLANT;  Surgeon: Thurmon Fair, MD;  Location: [facility];  Service: Cardiovascular;  Laterality: N/A;    Current Medications: Current Meds  Medication Sig   acetaminophen (TYLENOL) 500 MG tablet Take 500-1,000 mg by mouth every 6 (six) hours as needed (pain).   aspirin EC 81 MG EC tablet Take 1 tablet (81 mg total) by mouth daily. (Patient taking differently: Take 81 mg by mouth every evening.)   atorvastatin (LIPITOR) 40 MG tablet TAKE 1 TABLET BY MOUTH DAILY AT 6 PM.   diphenhydrAMINE (BENADRYL) 25 MG tablet Take 25 mg by mouth at bedtime as needed for sleep.   divalproex (DEPAKOTE) 125 MG DR tablet Take 125 mg by mouth 2 (two) times daily.   furosemide (LASIX) 20 MG tablet Take 20 mg by mouth in the morning.   loperamide (IMODIUM) 2 MG capsule Take 2 mg by mouth as needed.   LUMIGAN 0.01 % SOLN Place 1 drop into both eyes at bedtime.   mirtazapine (REMERON) 15 MG tablet Take 15 mg by mouth at bedtime.   mirtazapine (REMERON) 7.5 MG tablet Take 7.5 mg by mouth at bedtime.   Polyethyl Glyc-Propyl Glyc PF (SYSTANE ULTRA PF) 0.4-0.3 % SOLN Place 1-2 drops into  both eyes 3 (three) times daily as needed (dry/irritated eyes).   risperiDONE (RISPERDAL) 0.25 MG tablet Take 0.25 mg by mouth 2 (two) times daily.   sertraline (ZOLOFT) 50 MG tablet Take 50 mg by mouth in the morning.   traMADol (ULTRAM) 50 MG tablet Take 50 mg by mouth every 8 (eight) hours as needed. Patient takes 1 tablet twice daily.   White Petrolatum-Mineral Oil (SYSTANE NIGHTTIME) OINT Apply 1 application to eye at bedtime.   XIIDRA 5 % SOLN Place 1 drop into both eyes 2 (two) times daily.   Current Facility-Administered Medications for the 08/15/22 encounter (Office Visit) with Shah Insley, Rachelle Hora, MD  Medication   lidocaine-EPINEPHrine (XYLOCAINE W/EPI) 1 %-1:100000 (with pres) injection 10 mL   sodium chloride flush (NS) 0.9 % injection 3 mL     Allergies:   Patient has no known allergies.   Social History   Socioeconomic History   Marital status: Married    Spouse name: Not on file   Number  of children: Not on file   Years of education: Not on file   Highest education level: Not on file  Occupational History   Not on file  Tobacco Use   Smoking status: Never   Smokeless tobacco: Never  Substance and Sexual Activity   Alcohol use: No   Drug use: No   Sexual activity: Not on file  Other Topics Concern   Not on file  Social History Narrative   Not on file   Social Determinants of Health   Financial Resource Strain: Not on file  Food Insecurity: Not on file  Transportation Needs: Not on file  Physical Activity: Not on file  Stress: Not on file  Social Connections: Not on file     Family History: The patient's family history is significantly negative for premature Cardiac/vascular disease or arrhythmia  ROS:   Please see the history of present illness.    All other systems are reviewed and are negative.   EKGs/Labs/Other Studies Reviewed:    The following studies were reviewed today: Comprehensive pacemaker check.  See above.  EKG: Ordered today and  personally reviewed.  It shows sinus rhythm with first-degree AV block and incomplete right bundle branch block with prominent R wave in leads V1 and V2, left anterior fascicular block, cannot exclude old inferoposterior myocardial infarction  Recent Labs: No results found for requested labs within last 365 days.  Recent Lipid Panel    Component Value Date/Time   CHOL 221 (H) 01/13/2019 0227   TRIG 96 01/13/2019 0227   HDL 39 (L) 01/13/2019 0227   CHOLHDL 5.7 01/13/2019 0227   VLDL 19 01/13/2019 0227   LDLCALC NOT CALCULATED 01/13/2019 0227    Physical Exam:    VS:  BP 104/60 (BP Location: Left Arm, Patient Position: Sitting, Cuff Size: Normal)   Pulse 75   Ht 5\' 3"  (1.6 m)   Wt 164 lb 9.6 oz (74.7 kg)   SpO2 94%   BMI 29.16 kg/m     Wt Readings from Last 3 Encounters:  08/15/22 164 lb 9.6 oz (74.7 kg)  09/02/21 158 lb 15.2 oz (72.1 kg)  05/21/21 159 lb (72.1 kg)      General: Elderly, frail, in wheelchair alert, oriented x2, no distress, healthy appearing left subclavian pacemaker site. Head: no evidence of trauma, PERRL, EOMI, no exophtalmos or lid lag, no myxedema, no xanthelasma; normal ears, nose and oropharynx Neck: normal jugular venous pulsations and no hepatojugular reflux; brisk carotid pulses without delay and no carotid bruits Chest: clear to auscultation, no signs of consolidation by percussion or palpation, normal fremitus, symmetrical and full respiratory excursions Cardiovascular: normal position and quality of the apical impulse, regular rhythm, normal first and second heart sounds, no murmurs, rubs or gallops Abdomen: no tenderness or distention, no masses by palpation, no abnormal pulsatility or arterial bruits, normal bowel sounds, no hepatosplenomegaly Extremities: no clubbing, cyanosis or edema; 2+ radial, ulnar and brachial pulses bilaterally; 2+ right femoral, posterior tibial and dorsalis pedis pulses; 2+ left femoral, posterior tibial and dorsalis pedis  pulses; no subclavian or femoral bruits Neurological: grossly nonfocal Psych: Normal mood and affect     ASSESSMENT:    1. Paroxysmal atrial fibrillation (HCC)   2. Second degree AV block   3. Pacemaker   4. Hypercholesterolemia   5. Aortic atherosclerosis (HCC)   6. Idiopathic acute pancreatitis, unspecified complication status   7. Other chronic pain   8. Moderate dementia with agitation, unspecified dementia type (HCC)  PLAN:    In order of problems listed above:  AFib: Continues to have occasional episodes of atrial fibrillation but they are very brief and the overall burden of arrhythmia is very low.  I think the risk of anticoagulation in this elderly, frail gentleman with frequent falls exceeds the potential benefit.  CHA2DS2-VASc risk score is only 2-3 (age 45, evidence of atherosclerosis on imaging studies without clinically evident CAD or PAD).  We will continue to monitor via his pacemaker.  The device was programmed to send an urgent notification for episodes of atrial fibrillation lasting 2 hours or longer. Second-degree atrioventricular block: He is not pacemaker dependent.  Initially syncope was alleviated by pacemaker implantation but he now appears to be having falls and syncopal events due to orthostatic hypotension.  Unfortunately he needs to continue his diuretic for what is otherwise uncomfortable severe lower extremity edema.  Advised placing the compression stockings on before he even gets out of bed.  May benefit from adding an abdominal binder. Pacemaker: Normal device function.  Continue remote downloads every 3 months. HLP: He has imaging evidence of aortic atherosclerosis and coronary atherosclerosis, but had a low risk nuclear stress test, has never had angina pectoris or clinical evidence of PAD.  Continue statin and aspirin.  Target LDL preferably less than 70, but avoid polypharmacy in this nonagenarian without ischemic events to date.   History of  idiopathic pancreatitis: Recurrent events of uncertain etiology.  Chronic pain due to lumbosacral degenerative spine disease Worsening dementia: He has clear evidence of functional and mental decline that is accelerating.  He is occasionally agitated, especially in the evenings.  I think he would benefit from palliative care evaluation.  It may soon be time to discuss even more conservative approach such as home hospice.    Medication Adjustments/Labs and Tests Ordered: Current medicines are reviewed at length with the patient today.  Concerns regarding medicines are outlined above.  Orders Placed This Encounter  Procedures   Amb Referral to Palliative Care   EKG 12-Lead    No orders of the defined types were placed in this encounter.    Patient Instructions  Medication Instructions:  No changes *If you need a refill on your cardiac medications before your next appointment, please call your pharmacy*  Follow-Up: At [facility], you and your health needs are our priority.  As part of our continuing mission to provide you with exceptional heart care, we have created designated Provider Care Teams.  These Care Teams include your primary Cardiologist (physician) and Advanced Practice Providers (APPs -  Physician Assistants and Nurse Practitioners) who all work together to provide you with the care you need, when you need it.  We recommend signing up for the patient portal called "MyChart".  Sign up information is provided on this After Visit Summary.  MyChart is used to connect with patients for Virtual Visits (Telemedicine).  Patients are able to view lab/test results, encounter notes, upcoming appointments, etc.  Non-urgent messages can be sent to your provider as well.   To learn more about what you can do with MyChart, go to [email or web address].    Your next appointment:   1 year(s)  Provider:   Thurmon Fair, MD       Signed, Thurmon Fair, MD  08/15/2022 4:40 PM     [facility]

## 2022-08-15 NOTE — Progress Notes (Signed)
COMMUNITY PALLIATIVE CARE SW NOTE  PATIENT NAME: Jacob Kirk DOB: Aug 27, 1924 MRN: [id number]  PRIMARY CARE PROVIDER: Tracey Harries, MD  RESPONSIBLE PARTY:  Acct ID - Guarantor Home Phone Work Phone Relationship Acct Type  [id number] - Maddalena,HA* [phone]  Self P/F     [address], [place], [place] [address]   Initial Palliative Care Encounter/Clinical Social Work  SW completed a telephonic encounter with patient's son-Eric. SW provided education regarding the palliative care services, role in patient's care and visit frequency. SW provided the contact information the team. Minerva Areola provide an initial verbal consent to services, along with a brief status updates on patient.  Minerva Areola report that patient is slowly declining. He is legally blind, HOH, dementia, scoliosis that has resulted to patient having back pain. His decline has also impacted his mobility. He has had a mental and physical decline as well. Minerva Areola feels that patient maybe closer to end of life. They have  [facility] for private caregiving 2x week.  Minerva Areola is patient's POA/HCPOA.   Next scheduled visit 08/21/22@3pm     Social History   Tobacco Use   Smoking status: Never   Smokeless tobacco: Never  Substance Use Topics   Alcohol use: No    CODE STATUS: DNR ADVANCED DIRECTIVES: Yes MOST FORM COMPLETE:  No HOSPICE EDUCATION PROVIDED: Yes  Duration of encounter and documentation: 30 minutes  [employer], LCSW

## 2022-08-19 ENCOUNTER — Other Ambulatory Visit: Payer: Self-pay

## 2022-08-19 VITALS — BP 110/62 | HR 75 | Resp 18

## 2022-08-19 DIAGNOSIS — Z515 Encounter for palliative care: Secondary | ICD-10-CM

## 2022-08-21 NOTE — Progress Notes (Signed)
1457 Palliative Care Initial Encounter Note   PATIENT NAME: Jacob Kirk DOB: [birth year] MRN: [id number]  PRIMARY CARE PROVIDER: Tracey Harries, MD  RESPONSIBLE PARTY:  Acct ID - Guarantor Home Phone Work Phone Relationship Acct Type  [id number] [facility]* [phone]  Self P/F     [address], [place], [place] [address]   RN/SW completed home visit. Son also present . Received referral from Dr. Royann Shivers   HISTORY OF PRESENT ILLNESS:  89 male with past medical significant for dementia, lumbosacral spinal stenosis, dyslipidemia, second-degree AV block Mobitz type I, possible intermittent 3rd degree AV block, s/p idual chamber pacemaker (Medtronic Azure April 2022), and recurrent pancreatitis.     Socially: Pt lives with son. Has lived here since 2017. Three story home and pt stays on main level.    Cognitive: Alert and oriented to person. Participates in conversations. Does not answer questions appropriately. When questioned, says his son is "a real good friend of the family".    Appetite: Son reports eating 3 meals with snacks. Doesn't drink as much as he should according to son. Guesses that pt drinks approx. 20 oz of fluid per day.   Mobility: Pt states he uses a cane all the time for balance. Last fall was about 3 weeks ago. Sitting on a rocker with no arms and the cushion slid right off.   Sleeping Pattern: Son reports that he goes to bed about 7p and wakes around 7a if he sleeps thru. Son reports that he naps off and on all day.   Pain: pt says that knees and pelvis hurts today. Pt has had bilat hip fxs. Unable to rate pain at present but says, "its not too bad now as I am sitting still." Takes tramadol  every 6 hours as well as extra strength tylenol  Gi/GU: Does have the urge to urinate. During the day he walks to the bathroom, but he asks for help in the evenings. He struggles with getting depends back up.   Palliative Care/ Hospice: RN explained role and purpose of  palliative care including visit frequency. Also discussed benefits of hospice care as well as the differences between the two with patient.   Goals of Care: stay with son until he dies.     CODE STATUS: DNR ADVANCED DIRECTIVES: Y MOST FORM: No PPS: 50  Next appt scheduled: Dee/Monica to call to schedule appt for 3-4 weeks.     PHYSICAL EXAM:   VITALS: Today's Vitals   08/19/22 1542  BP: 110/62  Pulse: 75  Resp: 18  SpO2: 94%  PainSc: 0-No pain    LUNGS: diminished in bases CARDIAC:  irregular, no JVD EXTREMITIES: MAE x 4, 1+ pedal edema noted SKIN: healing wound to left first finger, scabbed no s/s of infection. Also has healing wound to top of head about an inch in length, scabbed over.      Barbette Merino, RN

## 2022-09-05 NOTE — Progress Notes (Signed)
Remote pacemaker transmission.   

## 2022-09-19 ENCOUNTER — Encounter (INDEPENDENT_AMBULATORY_CARE_PROVIDER_SITE_OTHER): Payer: Medicare Other | Admitting: Ophthalmology

## 2022-09-29 ENCOUNTER — Encounter: Payer: Self-pay | Admitting: Cardiovascular Disease

## 2022-09-30 MED ORDER — ATORVASTATIN CALCIUM 40 MG PO TABS: ORAL_TABLET | ORAL | 3 refills | Status: AC

## 2022-10-27 ENCOUNTER — Encounter: Payer: Self-pay | Admitting: Cardiovascular Disease

## 2022-10-28 ENCOUNTER — Ambulatory Visit: Payer: Medicare Other

## 2022-10-30 ENCOUNTER — Encounter: Payer: Self-pay | Admitting: Cardiovascular Disease

## 2022-10-30 LAB — CUP PACEART REMOTE DEVICE CHECK
Battery Remaining Longevity: 139 mo
Battery Voltage: 3.03 V
Brady Statistic AP VP Percent: 7.87 %
Brady Statistic AP VS Percent: 15.28 %
Brady Statistic AS VP Percent: 18.5 %
Brady Statistic AS VS Percent: 58.35 %
Brady Statistic RA Percent Paced: 22.25 %
Brady Statistic RV Percent Paced: 26.37 %
Date Time Interrogation Session: 20240715014107
Implantable Lead Connection Status: 753985
Implantable Lead Connection Status: 753985
Implantable Lead Implant Date: 20220418
Implantable Lead Implant Date: 20220418
Implantable Lead Location: 753859
Implantable Lead Location: 753860
Implantable Lead Model: 5076
Implantable Lead Model: 5076
Implantable Pulse Generator Implant Date: 20220418
Lead Channel Impedance Value: 304 Ohm
Lead Channel Impedance Value: 304 Ohm
Lead Channel Impedance Value: 342 Ohm
Lead Channel Impedance Value: 342 Ohm
Lead Channel Pacing Threshold Amplitude: 0.625 V
Lead Channel Pacing Threshold Amplitude: 1 V
Lead Channel Pacing Threshold Pulse Width: 0.4 ms
Lead Channel Pacing Threshold Pulse Width: 0.4 ms
Lead Channel Sensing Intrinsic Amplitude: 15.125 mV
Lead Channel Sensing Intrinsic Amplitude: 15.125 mV
Lead Channel Sensing Intrinsic Amplitude: 2.25 mV
Lead Channel Sensing Intrinsic Amplitude: 2.25 mV
Lead Channel Setting Pacing Amplitude: 1.5 V
Lead Channel Setting Pacing Amplitude: 2 V
Lead Channel Setting Pacing Pulse Width: 0.4 ms
Lead Channel Setting Sensing Sensitivity: 0.9 mV
Zone Setting Status: 755011

## 2022-11-14 DEATH — deceased

## 2022-12-10 IMAGING — CR DG PELVIS 1-2V
1 series · 1 of 1 positions shown · non-contrast
Comparison: No priors.

CLINICAL DATA: [AGE] male with history of unwitnessed fall.

EXAM:
PELVIS - 1-2 VIEW

[pelvis ap]
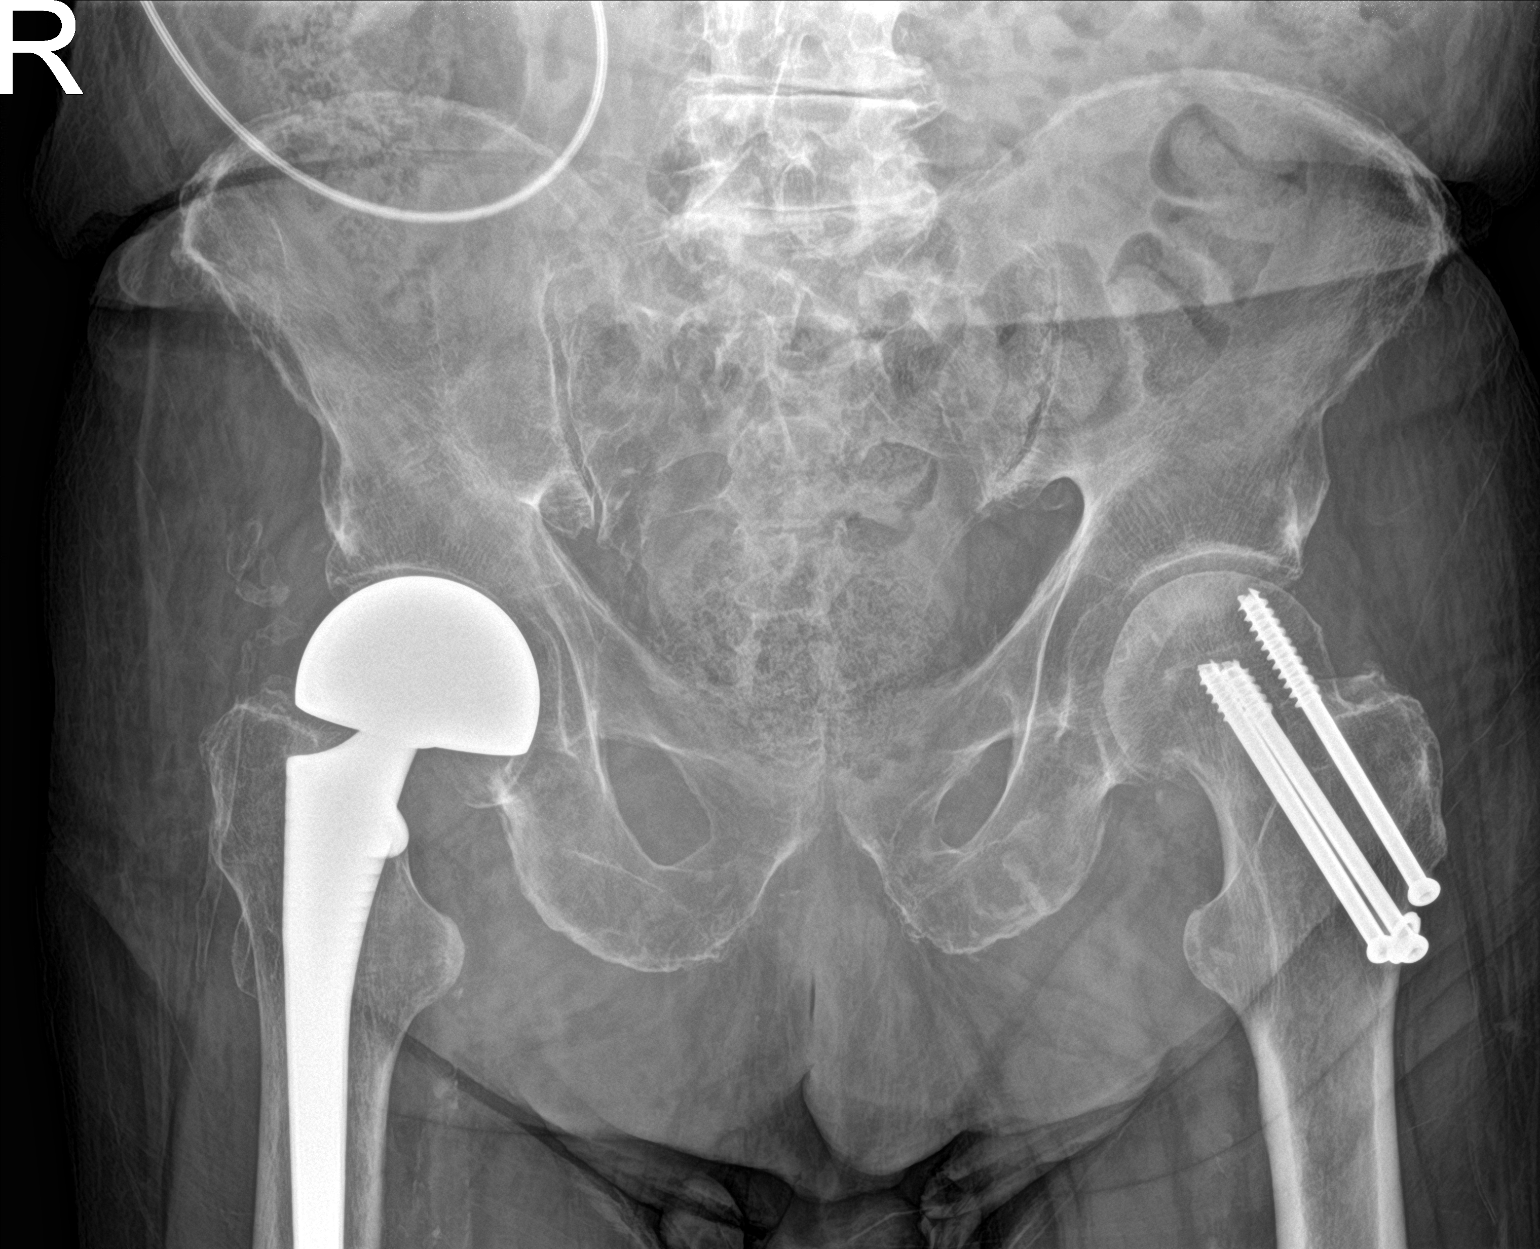

[1 of 1 positions shown; findings below may reference images not displayed]

FINDINGS: Single AP view of the pelvis demonstrates no definite acute
displaced fractures of the bony pelvic ring. Status post right hip
hemiarthroplasty. The prosthetic femoral head appears located in the
acetabulum on this single view examination. Bilateral proximal
femurs as visualized appear intact. Postoperative changes of ORIF
are noted in the left femoral neck where there are 3 cannulated
fixation screws.
IMPRESSION: 1. No acute radiographic abnormality of the bony pelvis.
2. Postoperative changes, as above.

## 2022-12-10 IMAGING — CT CT HEAD W/O CM
4 series · 15 of 47 positions shown, 17 images · non-contrast
Comparison: 11/28/2019

CLINICAL DATA: Trauma



[Series 3: head without · axial · non-contrast · 0.43mm/px · z∈[-83,+37]mm · 7 of 34 slices shown, 9 images]
[im 5/34  brain]
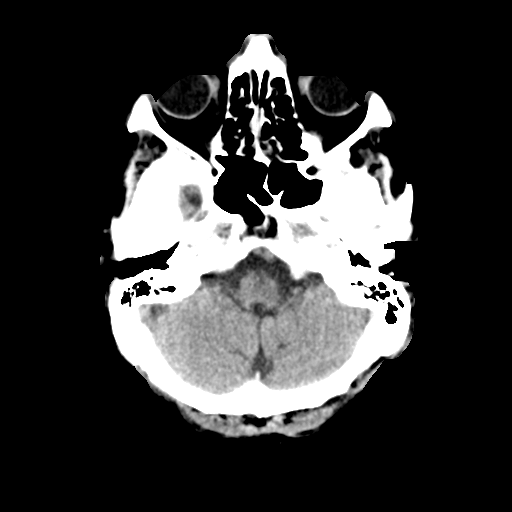
[im 5/34  bone]
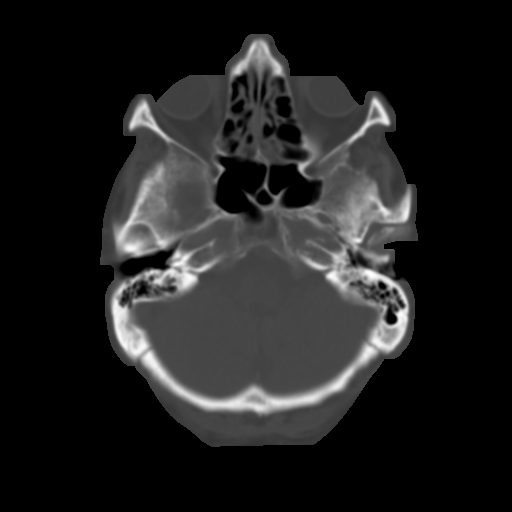
[im 9/34  brain]
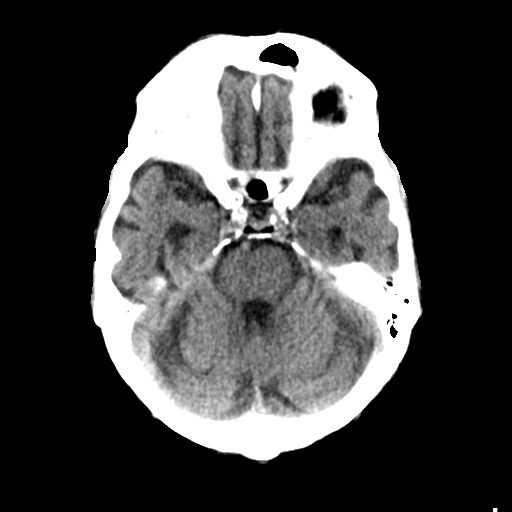
[im 13/34  brain]
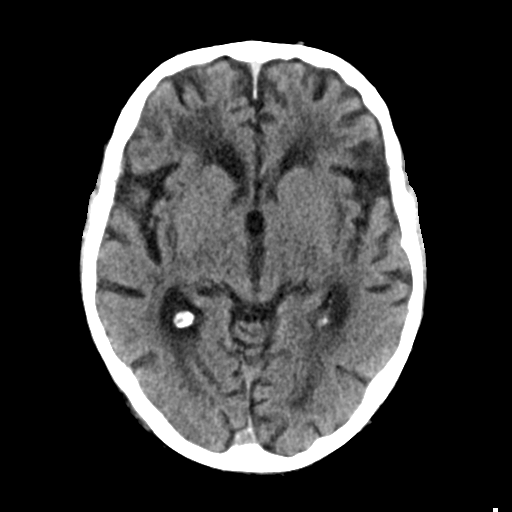
[im 17/34  brain]
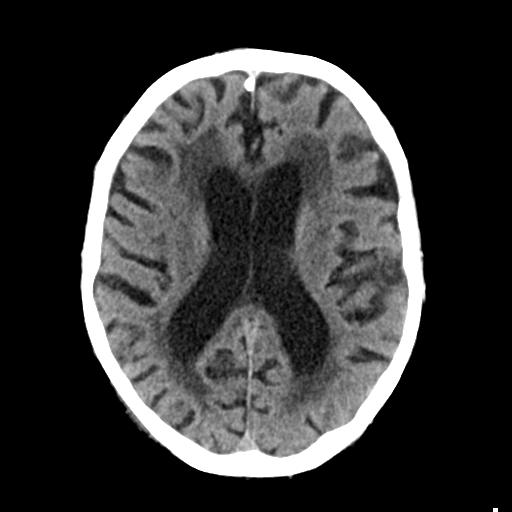
[im 21/34  brain]
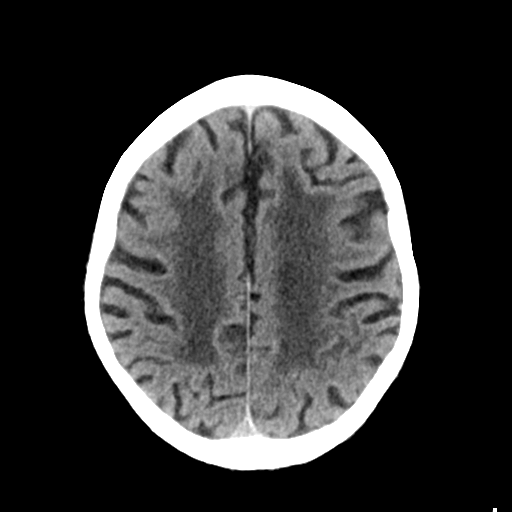
[im 21/34  bone]
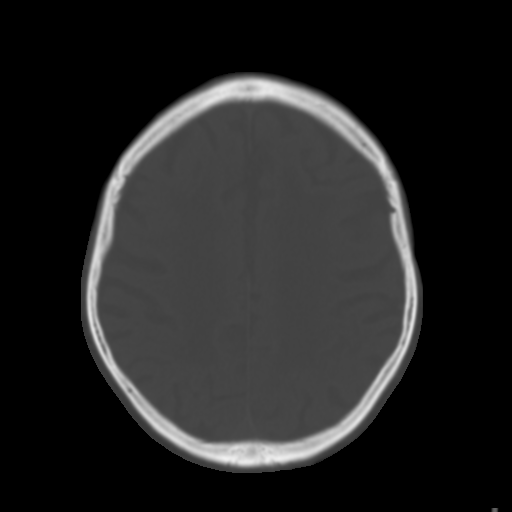
[im 25/34  brain]
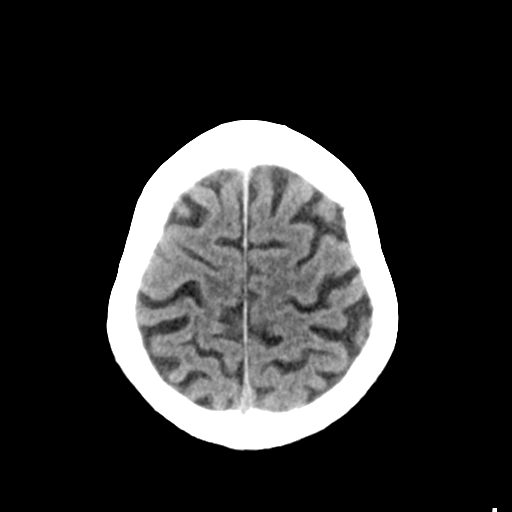
[im 29/34  brain]
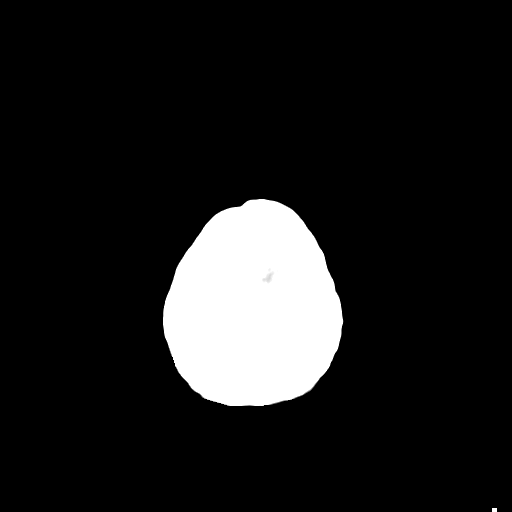

[Series 4: head bone · axial · 0.43mm/px · z∈[-87,-71]mm · 2 of 84 slices shown]
[im 9/84  bone]
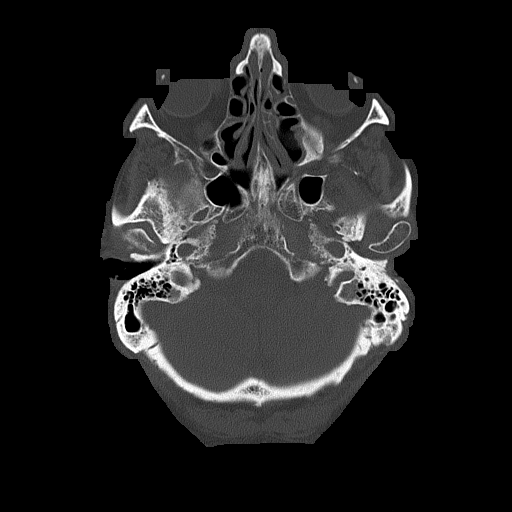
[im 17/84  bone]
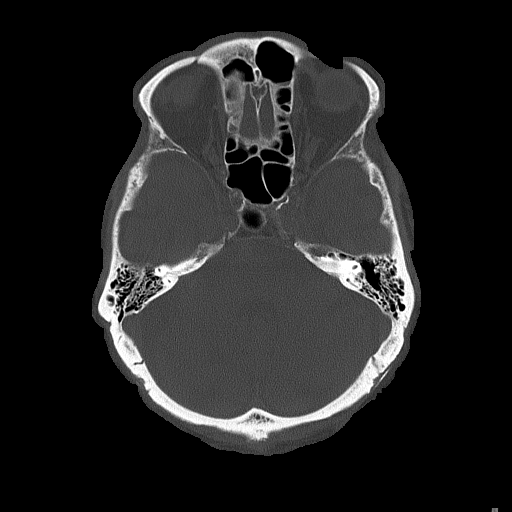

[Series 5: head without cor · coronal · non-contrast · 0.30mm/px · 3 of 71 slices shown]
[im 24/71  brain]
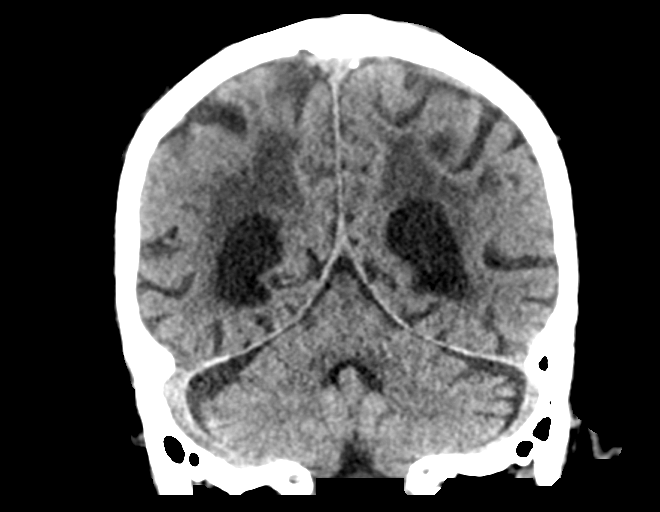
[im 32/71  brain]
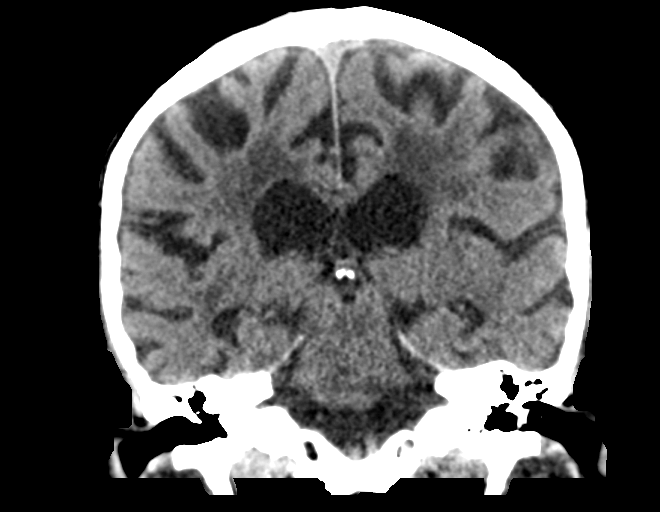
[im 39/71  brain]
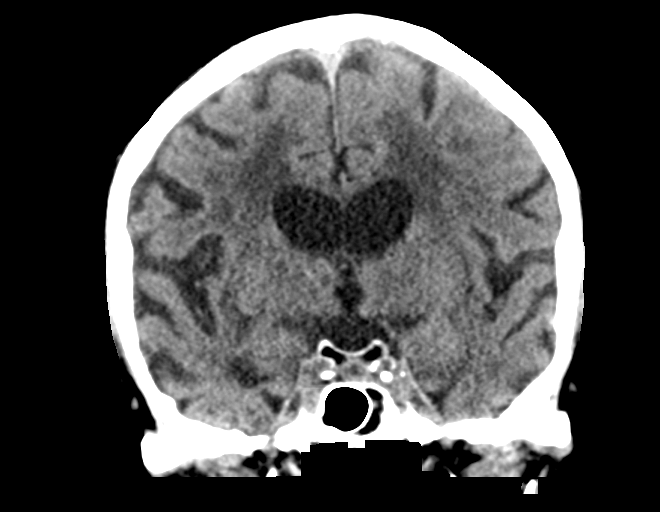

[Series 6: head without sag · sagittal · non-contrast · 0.31mm/px · 3 of 67 slices shown]
[im 23/67  brain]
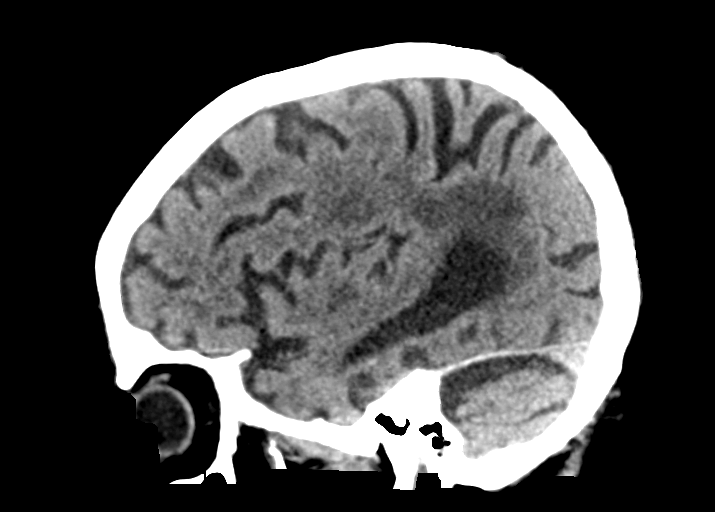
[im 34/67  brain]
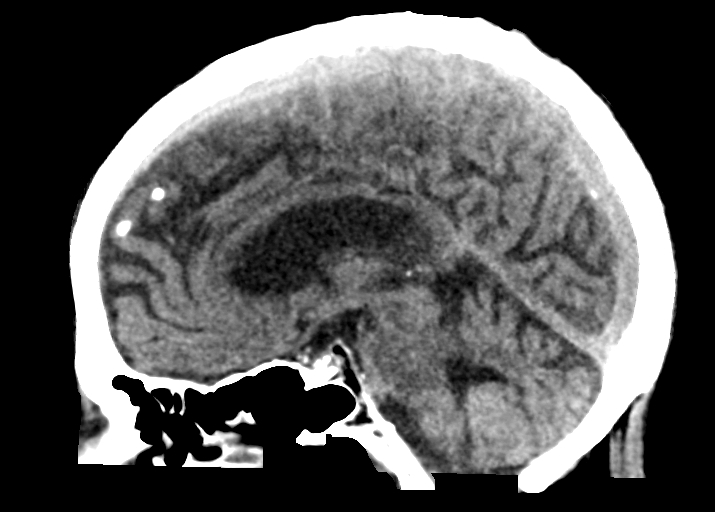
[im 45/67  brain]
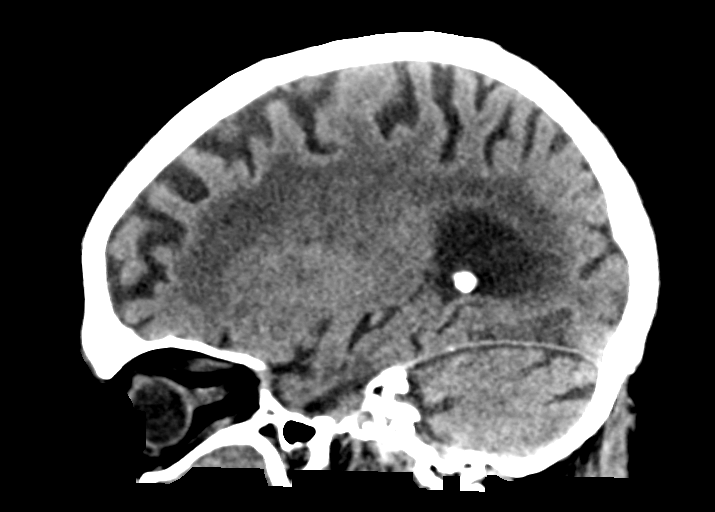

[15 of 47 positions shown; findings below may reference images not displayed]

FINDINGS: Brain: No acute intracranial findings are seen in noncontrast CT
brain. There are no signs of bleeding within the cranium. Cortical
sulci are prominent. There is decreased density in the
periventricular and subcortical white matter.

Vascular: Coarse arterial calcifications are seen.

Skull: No fracture is seen in the calvarium.

Sinuses/Orbits: There is opacification of left maxillary sinus.
There is mucosal thickening in the ethmoid sinus. Deformity is seen
in the nasal bones.

Other: None.
IMPRESSION: No acute intracranial findings are seen in noncontrast CT brain.
Atrophy. Small-vessel disease.

Chronic sinusitis.  Possible nasal bone fractures are seen.

## 2022-12-14 ENCOUNTER — Emergency Department: Admit: 2022-12-14 | Payer: Medicare Other | Source: Home / Self Care
